# Patient Record
Sex: Male | Born: 1975 | Hispanic: No | Marital: Single | Smoking: Never smoker
Health system: Southern US, Community
[De-identification: ages and names within clinical notes are randomized; demographics above are authoritative.]

## PROBLEM LIST (undated history)

## (undated) DIAGNOSIS — E119 Type 2 diabetes mellitus without complications: Secondary | ICD-10-CM

## (undated) DIAGNOSIS — I10 Essential (primary) hypertension: Secondary | ICD-10-CM

## (undated) DIAGNOSIS — Z955 Presence of coronary angioplasty implant and graft: Secondary | ICD-10-CM

## (undated) DIAGNOSIS — M109 Gout, unspecified: Secondary | ICD-10-CM

## (undated) DIAGNOSIS — K219 Gastro-esophageal reflux disease without esophagitis: Secondary | ICD-10-CM

## (undated) DIAGNOSIS — I509 Heart failure, unspecified: Secondary | ICD-10-CM

## (undated) DIAGNOSIS — I214 Non-ST elevation (NSTEMI) myocardial infarction: Secondary | ICD-10-CM

## (undated) HISTORY — PX: APPENDECTOMY: SHX54

## (undated) HISTORY — DX: Non-ST elevation (NSTEMI) myocardial infarction: I21.4

## (undated) SURGICAL SUPPLY — 1 items: INQWIRE 1.5J .035X260CM (WIRE) ×1 IMPLANT

---

## 1997-05-18 ENCOUNTER — Inpatient Hospital Stay (HOSPITAL_COMMUNITY): Admission: EM | Admit: 1997-05-18 | Discharge: 1997-05-20 | Payer: Self-pay | Admitting: Emergency Medicine

## 2003-12-29 ENCOUNTER — Emergency Department (HOSPITAL_COMMUNITY): Admission: EM | Admit: 2003-12-29 | Discharge: 2003-12-29 | Payer: Self-pay | Admitting: Emergency Medicine

## 2003-12-29 ENCOUNTER — Ambulatory Visit (HOSPITAL_COMMUNITY): Admission: RE | Admit: 2003-12-29 | Discharge: 2003-12-29 | Payer: Self-pay | Admitting: Emergency Medicine

## 2004-01-06 ENCOUNTER — Emergency Department (HOSPITAL_COMMUNITY): Admission: EM | Admit: 2004-01-06 | Discharge: 2004-01-06 | Payer: Self-pay | Admitting: Emergency Medicine

## 2004-04-02 ENCOUNTER — Emergency Department (HOSPITAL_COMMUNITY): Admission: EM | Admit: 2004-04-02 | Discharge: 2004-04-02 | Payer: Self-pay | Admitting: Family Medicine

## 2018-09-21 ENCOUNTER — Encounter (HOSPITAL_COMMUNITY): Payer: Self-pay | Admitting: *Deleted

## 2018-09-21 ENCOUNTER — Emergency Department (HOSPITAL_COMMUNITY)
Admission: EM | Admit: 2018-09-21 | Discharge: 2018-09-21 | Disposition: A | Discharge status: Home / Self Care | Payer: HRSA Program | Attending: Emergency Medicine | Admitting: Emergency Medicine

## 2018-09-21 ENCOUNTER — Emergency Department (HOSPITAL_COMMUNITY): Payer: HRSA Program

## 2018-09-21 ENCOUNTER — Other Ambulatory Visit: Payer: Self-pay

## 2018-09-21 DIAGNOSIS — R05 Cough: Secondary | ICD-10-CM | POA: Insufficient documentation

## 2018-09-21 DIAGNOSIS — R5383 Other fatigue: Secondary | ICD-10-CM | POA: Diagnosis not present

## 2018-09-21 DIAGNOSIS — R079 Chest pain, unspecified: Secondary | ICD-10-CM | POA: Insufficient documentation

## 2018-09-21 DIAGNOSIS — Z20828 Contact with and (suspected) exposure to other viral communicable diseases: Secondary | ICD-10-CM | POA: Diagnosis not present

## 2018-09-21 DIAGNOSIS — R0602 Shortness of breath: Secondary | ICD-10-CM | POA: Diagnosis present

## 2018-09-21 LAB — CBC WITH DIFFERENTIAL/PLATELET
Abs Immature Granulocytes: 0.04 10*3/uL (ref 0.00–0.07)
Basophils Absolute: 0.1 10*3/uL (ref 0.0–0.1)
Basophils Relative: 0 %
Eosinophils Absolute: 0.1 10*3/uL (ref 0.0–0.5)
Eosinophils Relative: 1 %
HCT: 55.7 % — ABNORMAL HIGH (ref 39.0–52.0)
Hemoglobin: 18.6 g/dL — ABNORMAL HIGH (ref 13.0–17.0)
Immature Granulocytes: 0 %
Lymphocytes Relative: 16 %
Lymphs Abs: 1.8 10*3/uL (ref 0.7–4.0)
MCH: 25.5 pg — ABNORMAL LOW (ref 26.0–34.0)
MCHC: 33.4 g/dL (ref 30.0–36.0)
MCV: 76.3 fL — ABNORMAL LOW (ref 80.0–100.0)
Monocytes Absolute: 0.9 10*3/uL (ref 0.1–1.0)
Monocytes Relative: 8 %
Neutro Abs: 8.4 10*3/uL — ABNORMAL HIGH (ref 1.7–7.7)
Neutrophils Relative %: 75 %
Platelets: 267 10*3/uL (ref 150–400)
RBC: 7.3 MIL/uL — ABNORMAL HIGH (ref 4.22–5.81)
RDW: 14.6 % (ref 11.5–15.5)
WBC: 11.2 10*3/uL — ABNORMAL HIGH (ref 4.0–10.5)
nRBC: 0 % (ref 0.0–0.2)

## 2018-09-21 LAB — COMPREHENSIVE METABOLIC PANEL
ALT: 40 U/L (ref 0–44)
AST: 36 U/L (ref 15–41)
Albumin: 4.2 g/dL (ref 3.5–5.0)
Alkaline Phosphatase: 78 U/L (ref 38–126)
Anion gap: 13 (ref 5–15)
BUN: 20 mg/dL (ref 6–20)
CO2: 23 mmol/L (ref 22–32)
Calcium: 9.7 mg/dL (ref 8.9–10.3)
Chloride: 98 mmol/L (ref 98–111)
Creatinine, Ser: 1.14 mg/dL (ref 0.61–1.24)
GFR calc Af Amer: >60 mL/min (ref >60)
GFR calc non Af Amer: >60 mL/min (ref >60)
Glucose, Bld: 181 mg/dL — ABNORMAL HIGH (ref 70–99)
Potassium: 3.6 mmol/L (ref 3.5–5.1)
Sodium: 134 mmol/L — ABNORMAL LOW (ref 135–145)
Total Bilirubin: 1.5 mg/dL — ABNORMAL HIGH (ref 0.3–1.2)
Total Protein: 7.6 g/dL (ref 6.5–8.1)

## 2018-09-21 LAB — SARS CORONAVIRUS 2 BY RT PCR (HOSPITAL ORDER, PERFORMED IN ~~LOC~~ HOSPITAL LAB): SARS Coronavirus 2: NEGATIVE

## 2018-09-21 MED ORDER — SODIUM CHLORIDE 0.9 % IV BOLUS: 1000.0000 mL | Freq: Once | INTRAVENOUS | Status: DC

## 2018-09-21 MED ORDER — IOHEXOL 350 MG/ML SOLN
100.0000 mL | Freq: Once | INTRAVENOUS | Status: AC | PRN
  Administered 2018-09-21: 100 mL via INTRAVENOUS

## 2018-09-21 MED ORDER — ONDANSETRON HCL 4 MG/2ML IJ SOLN
4.0000 mg | Freq: Once | INTRAMUSCULAR | Status: AC
  Administered 2018-09-21: 4 mg via INTRAVENOUS
  Filled 2018-09-21: qty 2

## 2018-09-21 NOTE — ED Triage Notes (Signed)
Pt reports headaches, shortness of breath, nausea, fatigue, and dizziness. Pt says he is from [place] and his mother and father are covid positive. Diaphoretic in triage.

## 2018-09-21 NOTE — Discharge Instructions (Addendum)
You have been seen today for shortness of breath. Please read and follow all provided instructions. Return to the emergency room for worsening condition or new concerning symptoms.    Your covid test today is negative, The CT scan of your chest did not show signs of a pulmonary embolism. -You were dehydrated today, you should try to drink more water over the next several days  1. Medications:  Continue usual home medications Take medications as prescribed. Please review all of the medicines and only take them if you do not have an allergy to them.  -Your blood pressure was also high today. You need to have it rechecked within 1 week. If it continues to be high you needs to see a primary care doctor to talk about possible medication management. I have included the information for [facility] and [facility] if you do not have a doctor. Call to schedule follow up appointment within 1 week  2. Treatment: rest, drink plenty of fluids 3. Follow Up: Please follow up with your primary doctor in 2-5 days for discussion of your diagnoses and further evaluation after today's visit; Call today to arrange your follow up.  If you do not have a primary care doctor use the resource guide provided to find one;   ?

## 2018-09-21 NOTE — ED Notes (Addendum)
Pt tolerated ambulation. Pt experience ST HR 120-130, SpO2 93-98... Pt denied SOB, but experience mild dizziness

## 2018-09-21 NOTE — ED Provider Notes (Signed)
[name].  Please see her full H&P.  In short,  Darren Garrett is a 43 y.o. male presents for shortness of breath.  He was concerned to have COVID-19 because he has positive family members that he has visited recently in [place].Today his COVID test is negative.  Work up by previous provider includes unremarkable CMP.  CBC with mild leukocytosis of 11.2.  Patient is hemoconcentrated, possible dehydration. CTA pending for possible PE. If negative will discharge home,  Physical Exam  BP (!) 145/100   Pulse (!) 101   Temp 98.3 F (36.8 C) (Oral)   Resp 18   SpO2 99%   Physical Exam  PE: Constitutional: well-developed, well-nourished, no apparent distress HENT: normocephalic, atraumatic. no cervical adenopathy Cardiovascular: normal rate and rhythm, distal pulses intact Pulmonary/Chest: effort normal; breath sounds clear and equal bilaterally; no wheezes or rales Abdominal: soft and nontender Musculoskeletal: full ROM, no edema Neurological: alert with goal directed thinking Skin: warm and dry, no rash, no diaphoresis Psychiatric: normal mood and affect, normal behavior    ED Course/Procedures   Results for orders placed or performed during the hospital encounter of 09/21/18 (from the past 24 hour(s))  SARS Coronavirus 2 [facility](Hospital order, Performed in [facility] lab) Nasopharyngeal Nasopharyngeal Swab     Status: None   Collection Time: 09/21/18  4:19 AM   Specimen: Nasopharyngeal Swab  Result Value Ref Range   SARS Coronavirus 2 NEGATIVE NEGATIVE  CBC with Differential     Status: Abnormal   Collection Time: 09/21/18  4:37 AM  Result Value Ref Range   WBC 11.2 (H) 4.0 - 10.5 K/uL   RBC 7.30 (H) 4.22 - 5.81 MIL/uL   Hemoglobin 18.6 (H) 13.0 - 17.0 g/dL   HCT 81.155.7 (H) 91.439.0 - 78.252.0 %   MCV 76.3 (L) 80.0 - 100.0 fL   MCH 25.5 (L) 26.0 - 34.0 pg   MCHC 33.4 30.0 - 36.0 g/dL   RDW 95.614.6 21.311.5 - 08.615.5 %   Platelets 267 150 - 400 K/uL   nRBC 0.0 0.0 - 0.2  %   Neutrophils Relative % 75 %   Neutro Abs 8.4 (H) 1.7 - 7.7 K/uL   Lymphocytes Relative 16 %   Lymphs Abs 1.8 0.7 - 4.0 K/uL   Monocytes Relative 8 %   Monocytes Absolute 0.9 0.1 - 1.0 K/uL   Eosinophils Relative 1 %   Eosinophils Absolute 0.1 0.0 - 0.5 K/uL   Basophils Relative 0 %   Basophils Absolute 0.1 0.0 - 0.1 K/uL   Immature Granulocytes 0 %   Abs Immature Granulocytes 0.04 0.00 - 0.07 K/uL  Comprehensive metabolic panel     Status: Abnormal   Collection Time: 09/21/18  4:37 AM  Result Value Ref Range   Sodium 134 (L) 135 - 145 mmol/L   Potassium 3.6 3.5 - 5.1 mmol/L   Chloride 98 98 - 111 mmol/L   CO2 23 22 - 32 mmol/L   Glucose, Bld 181 (H) 70 - 99 mg/dL   BUN 20 6 - 20 mg/dL   Creatinine, Ser 5.781.14 0.61 - 1.24 mg/dL   Calcium 9.7 8.9 - 46.910.3 mg/dL   Total Protein 7.6 6.5 - 8.1 g/dL   Albumin 4.2 3.5 - 5.0 g/dL   AST 36 15 - 41 U/L   ALT 40 0 - 44 U/L   Alkaline Phosphatase 78 38 - 126 U/L   Total Bilirubin 1.5 (H) 0.3 - 1.2 mg/dL  GFR calc non Af Amer >60 >60 mL/min   GFR calc Af Amer >60 >60 mL/min   Anion gap 13 5 - 15   CT ANGIOGRAPHY CHEST WITH CONTRAST    TECHNIQUE:  Multidetector CT imaging of the chest was performed using the  standard protocol during bolus administration of intravenous  contrast. Multiplanar CT image reconstructions and MIPs were  obtained to evaluate the vascular anatomy.    CONTRAST: 67 mL OMNIPAQUE IOHEXOL 350 MG/ML SOLN    COMPARISON: Radiograph of same day.    FINDINGS:  Cardiovascular: Satisfactory opacification of the pulmonary arteries  to the segmental level. No evidence of pulmonary embolism. Normal  heart size. No pericardial effusion.    Mediastinum/Nodes: No enlarged mediastinal, hilar, or axillary lymph  nodes. Thyroid gland, trachea, and esophagus demonstrate no  significant findings.    Lungs/Pleura: Lungs are clear. No pleural effusion or pneumothorax.    Upper Abdomen: No acute abnormality.     Musculoskeletal: No chest wall abnormality. No acute or significant  osseous findings.    Review of the MIP images confirms the above findings.    IMPRESSION:  No definite evidence of pulmonary embolus. No definite abnormality  seen in the chest.      Electronically Signed  By: Marijo Conception M.D.  On: 09/21/2018 07:23   PORTABLE CHEST 1 VIEW    COMPARISON: Prior radiograph from 11/20/2006.    FINDINGS:  The cardiac and mediastinal silhouettes are stable in size and  contour, and remain within normal limits.    The lungs are normally inflated. No airspace consolidation, pleural  effusion, or pulmonary edema is identified. There is no  pneumothorax.    No acute osseous abnormality identified.    IMPRESSION:  No radiographic evidence for active cardiopulmonary disease.      Electronically Signed  By: Jeannine Boga M.D.  On: 09/21/2018 02:38       MDM   Patient received in signout.  His CTA is negative for PE.  After drinking p.o. fluids his tachycardia improved.  While I was in the room during exam his heart rate was below 100. I ambulated pt in the room and he did so without difficulty, no dyspnea, stable SpO2 97% on room air.  His blood pressure slightly elevated today.  Recommend he have this rechecked by primary care doctor within 1 week.  Patient does not have a primary care doctor.  He was given information for healthcare to clinic. Pt is stable to be discharged home. Strict ED return precautions given.   This note was prepared using Dragon voice recognition software and may include unintentional dictation errors due to the inherent limitations of voice recognition software.     Cherre Robins, PA-C 09/21/18 0867    Lajean Saver, MD 09/22/18 [phone]

## 2018-09-21 NOTE — ED Provider Notes (Signed)
[place] EMERGENCY DEPARTMENT Provider Note   CSN: [id number] Arrival date & time: 09/21/18  0110     History   Chief Complaint Chief Complaint  Patient presents with  . Shortness of Breath    HPI Darren Garrett is a 43 y.o. male.     Patient to ED with concern for having contracted COVID-19. He lives in [place] and recently visited family in [place] that tested positive 3 weeks ago. He reports his contact was limited however presents now with symptoms of fatigue, aches, cough, SOB, and nausea. No fever. No vomiting, diarrhea.   The history is provided by the patient. No language interpreter was used.  Shortness of Breath Associated symptoms: cough and diaphoresis   Associated symptoms: no fever and no vomiting     History reviewed. No pertinent past medical history.  There are no active problems to display for this patient.   History reviewed. No pertinent surgical history.      Home Medications    Prior to Admission medications   Not on File    Family History No family history on file.  Social History Social History   Tobacco Use  . Smoking status: Never Smoker  Substance Use Topics  . Alcohol use: Not Currently  . Drug use: Not Currently     Allergies   Patient has no known allergies.   Review of Systems Review of Systems  Constitutional: Positive for diaphoresis and fatigue. Negative for chills and fever.  HENT: Negative.   Respiratory: Positive for cough and shortness of breath.   Cardiovascular: Negative.   Gastrointestinal: Positive for nausea. Negative for vomiting.  Musculoskeletal: Negative.   Skin: Negative.   Neurological: Negative.      Physical Exam Updated Vital Signs BP (!) 156/96   Pulse (!) 107   Temp 98.3 F (36.8 C) (Oral)   Resp 18   SpO2 100%   Physical Exam Vitals signs and nursing note reviewed.  Constitutional:      Appearance: He is well-developed.  HENT:     Head: Normocephalic.  Neck:    Musculoskeletal: Normal range of motion and neck supple.  Cardiovascular:     Rate and Rhythm: Normal rate and regular rhythm.     Heart sounds: No murmur.  Pulmonary:     Effort: Pulmonary effort is normal.     Breath sounds: Normal breath sounds. No wheezing, rhonchi or rales.  Chest:     Chest wall: No tenderness.  Abdominal:     General: Bowel sounds are normal.     Palpations: Abdomen is soft.     Tenderness: There is no abdominal tenderness. There is no guarding or rebound.  Musculoskeletal: Normal range of motion.  Skin:    General: Skin is warm and dry.  Neurological:     Mental Status: He is alert and oriented to person, place, and time.      ED Treatments / Results  Labs (all labs ordered are listed, but only abnormal results are displayed) Labs Reviewed  SARS CORONAVIRUS 2 (HOSPITAL ORDER, [facility])  CBC WITH DIFFERENTIAL/PLATELET  COMPREHENSIVE METABOLIC PANEL    EKG EKG Interpretation  Date/Time:  Friday September 21 2018 01:20:26 EDT Ventricular Rate:  96 PR Interval:  134 QRS Duration: 78 QT Interval:  362 QTC Calculation: 457 R Axis:   72 Text Interpretation:  Normal sinus rhythm Normal ECG No previous ECGs available Confirmed by Ripley Fraise [phone]) on 09/21/2018 1:28:03 AM  Radiology Dg Chest Portable 1 View  Result Date: 09/21/2018 CLINICAL DATA:  Initial evaluation for acute shortness of breath. EXAM: PORTABLE CHEST 1 VIEW COMPARISON:  Prior radiograph from 11/20/2006. FINDINGS: The cardiac and mediastinal silhouettes are stable in size and contour, and remain within normal limits. The lungs are normally inflated. No airspace consolidation, pleural effusion, or pulmonary edema is identified. There is no pneumothorax. No acute osseous abnormality identified. IMPRESSION: No radiographic evidence for active cardiopulmonary disease. Electronically Signed   By: Rise MuBenjamin  McClintock M.D.   On: 09/21/2018 02:38     Procedures Procedures (including critical care time)  Medications Ordered in ED Medications  sodium chloride 0.9 % bolus 1,000 mL (has no administration in time range)     Initial Impression / Assessment and Plan / ED Course  I have reviewed the triage vital signs and the nursing notes.  Pertinent labs & imaging results that were available during my care of the patient were reviewed by me and considered in my medical decision making (see chart for details).        Patient to ED with symptoms of nausea, fatigue, cough, SOB, and body aches. He lives in a high endemic area for COVID-19 and has also had family members test positive for the virus.   The patient has concerning symptoms for COVID. At rest there is no SoB or hypoxia. He reports DOE. He is tachycardic. DDx: PE must be considered given he has driven from [place] to [place] multiple times in the last 2 weeks.   The patient is ambulated with O2 sats that go to 92-94% from 100% while at rest. Heart rate increases to 150's. CTA ordered. Rapid COVID pending.  COVID-19 negative. CTA pending for PE. Patient resting comfortably.  6:45 - delay in CTA r/o PE. Per CT tech, study can proceed and they will come for him soon.  Patient care signed out to oncoming provider team at end of shift pending CT results.   Final Clinical Impressions(s) / ED Diagnoses   Final diagnoses:  None   1. SOB 2. Chest pain 3. Fatigue  ED Discharge Orders    None       Danne HarborUpstill, Alton Tremblay, PA-C 09/21/18 0701    Palumbo, April, MD 09/21/18 [phone]

## 2018-09-21 NOTE — ED Triage Notes (Signed)
Pt arrives via [facility] from home. 2 family members are COVID positive in the home, fatigue, SOB, no fevers.

## 2020-05-27 ENCOUNTER — Other Ambulatory Visit: Payer: Self-pay

## 2020-05-27 ENCOUNTER — Ambulatory Visit (HOSPITAL_COMMUNITY)
Admission: EM | Admit: 2020-05-27 | Discharge: 2020-05-27 | Disposition: A | Discharge status: Home / Self Care | Payer: Self-pay

## 2020-05-27 ENCOUNTER — Encounter (HOSPITAL_COMMUNITY): Payer: Self-pay | Admitting: Emergency Medicine

## 2020-05-27 DIAGNOSIS — R509 Fever, unspecified: Secondary | ICD-10-CM

## 2020-05-27 DIAGNOSIS — I10 Essential (primary) hypertension: Secondary | ICD-10-CM

## 2020-05-27 DIAGNOSIS — R000 Tachycardia, unspecified: Secondary | ICD-10-CM

## 2020-05-27 DIAGNOSIS — R519 Headache, unspecified: Secondary | ICD-10-CM

## 2020-05-27 DIAGNOSIS — I160 Hypertensive urgency: Secondary | ICD-10-CM

## 2020-05-27 NOTE — ED Triage Notes (Signed)
Monday started sneezing.  Tuesday night, started feeling sick with headache, general body aches and chills

## 2020-05-27 NOTE — ED Provider Notes (Addendum)
[facility]    CSN: [id number] Arrival date & time: 05/27/20  1449      History   Chief Complaint Chief Complaint  Patient presents with  . Headache  . Generalized Body Aches    HPI Darren Garrett is a 45 y.o. male. Pt declines translator and is able to appropriately answer my questions.   HPI   Headache: Pt reports that on Monday he started sneezing on Monday. The next day he started having headache, general body aches and chills. He states that normally when he gets similar symptoms he goes to his primary care office and gets a "shot and in two hours [he] feels better and goes back to work". Today though he has a low grade fever which is abnormal so he questions if he has a virus. He also reports that his headache is worse than normal. No vomiting, neck stiffness, N/V/D. Has had some decreased appetite and loss of taste/smell. No visual changes, SOB, chest pain. He has not tried anything for symptoms but reports that he has had elevated BP in the past but has never started any BP medication.   History reviewed. No pertinent past medical history.  There are no problems to display for this patient.   Past Surgical History:  Procedure Laterality Date  . APPENDECTOMY       Home Medications    Prior to Admission medications   Medication Sig Start Date End Date Taking? Authorizing Provider  acetaminophen (TYLENOL) 325 MG tablet Take 650 mg by mouth every 6 (six) hours as needed.   Yes [provider]  ibuprofen (ADVIL) 200 MG tablet Take 200 mg by mouth every 6 (six) hours as needed.   Yes [provider]    Family History Family History  Problem Relation Age of Onset  . Healthy Mother     Social History Social History   Tobacco Use  . Smoking status: Never Smoker  . Smokeless tobacco: Never Used  Vaping Use  . Vaping Use: Never used  Substance Use Topics  . Alcohol use: Yes  . Drug use: Not Currently     Allergies   Patient has  no known allergies.   Review of Systems Review of Systems  As stated above in HPI Physical Exam Triage Vital Signs ED Triage Vitals  Enc Vitals Group     BP 05/27/20 1516 (!) 164/109     Pulse Rate 05/27/20 1516 (!) 123     Resp 05/27/20 1516 20     Temp 05/27/20 1516 99 F (37.2 C)     Temp Source 05/27/20 1516 Oral     SpO2 05/27/20 1516 97 %     Weight --      Height --      Head Circumference --      Peak Flow --      Pain Score 05/27/20 1512 10     Pain Loc --      Pain Edu? --      Excl. in GC? --    No data found.  Updated Vital Signs BP (!) 165/113 (BP Location: Left Arm) Comment (BP Location): repositioned  Pulse (!) 125   Temp 99 F (37.2 C) (Oral)   Resp 20   SpO2 97%   Physical Exam Vitals and nursing note reviewed.  Constitutional:      General: He is not in acute distress.    Appearance: He is well-developed. He is not ill-appearing, toxic-appearing or diaphoretic.  HENT:     Head: Normocephalic and atraumatic.     Mouth/Throat:     Mouth: Mucous membranes are moist.     Pharynx: Oropharynx is clear.  Eyes:     Extraocular Movements:     Right eye: Normal extraocular motion.     Left eye: Normal extraocular motion.     Pupils:     Right eye: Pupil is reactive.     Left eye: Pupil is not reactive.  Cardiovascular:     Rate and Rhythm: Normal rate and regular rhythm.     Heart sounds: Normal heart sounds.  Pulmonary:     Effort: Pulmonary effort is normal.     Breath sounds: Normal breath sounds.  Abdominal:     Palpations: Abdomen is soft.  Musculoskeletal:     Cervical back: Normal range of motion and neck supple.  Lymphadenopathy:     Cervical: No cervical adenopathy.  Skin:    General: Skin is warm.     Findings: No rash.  Neurological:     Mental Status: He is alert and oriented to person, place, and time.     Cranial Nerves: No cranial nerve deficit or facial asymmetry.     Sensory: No sensory deficit.     Motor: No weakness.      Coordination: Coordination normal.     Deep Tendon Reflexes: Reflexes normal.  Psychiatric:        Mood and Affect: Mood normal.        Speech: Speech normal.        Behavior: Behavior normal.      UC Treatments / Results  Labs (all labs ordered are listed, but only abnormal results are displayed) Labs Reviewed - No data to display  EKG   Radiology No results found.  Procedures Procedures (including critical care time)  Medications Ordered in UC Medications - No data to display  Initial Impression / Assessment and Plan / UC Course  I have reviewed the triage vital signs and the nursing notes.  Pertinent labs & imaging results that were available during my care of the patient were reviewed by me and considered in my medical decision making (see chart for details).     New. Likely viral in nature which has spurred on a migraine. Symptoms concerning for COVID-19 so testing is pending and he will take precautions. Will administer Toradol as this may lower his BP due to reduced pain. He will need to follow up with his PCP regarding his BP and will need to stay well hydrated with water.   UPDATE: Vitals recheck shows elevated BP with headache. Hypertensive urgency with tachycardia. He will need to be evaluated and treated in the emergency room to avoid systemic injury. He declined EMS and ER. I discussed with him my concerns multiple times. He finally agrees to go to the ER via private vehicle.    Final Clinical Impressions(s) / UC Diagnoses   Final diagnoses:  None   Discharge Instructions   None    ED Prescriptions    None     PDMP not reviewed this encounter.   Karen Kitchens 05/27/20 1620    Rushie Chestnut, PA-C 05/27/20 1637    Rushie Chestnut, PA-C 05/27/20 [phone]

## 2020-08-21 IMAGING — DX PORTABLE CHEST - 1 VIEW
1 series · 1 of 1 positions shown · non-contrast
Comparison: Prior radiograph from 11/20/2006.

CLINICAL DATA: Initial evaluation for acute shortness of breath.

EXAM:
PORTABLE CHEST 1 VIEW

[chest ap]
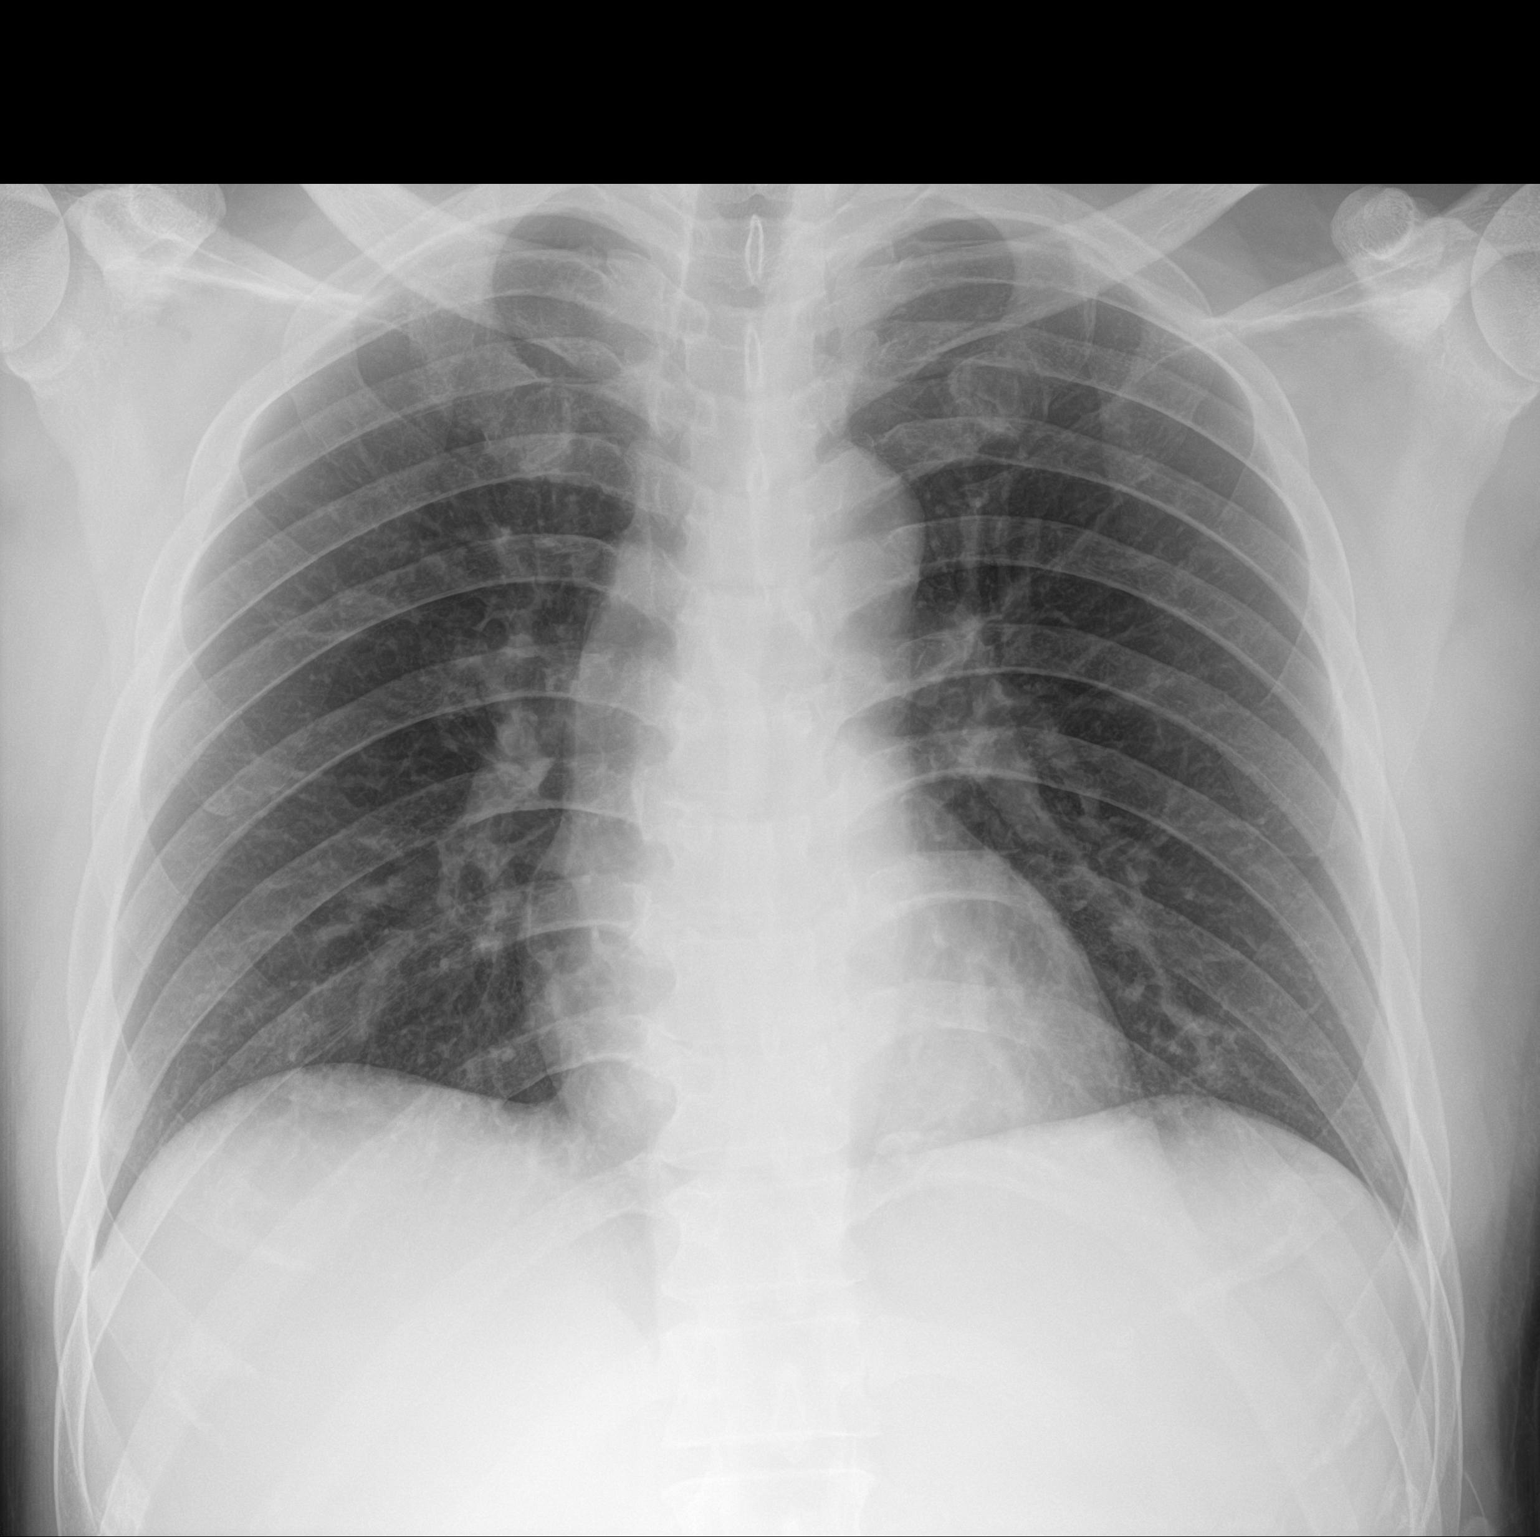

[1 of 1 positions shown; findings below may reference images not displayed]

FINDINGS: The cardiac and mediastinal silhouettes are stable in size and
contour, and remain within normal limits.

The lungs are normally inflated. No airspace consolidation, pleural
effusion, or pulmonary edema is identified. There is no
pneumothorax.

No acute osseous abnormality identified.
IMPRESSION: No radiographic evidence for active cardiopulmonary disease.

## 2020-08-21 IMAGING — CT CT ANGIOGRAPHY CHEST
2 of 6 series · 19 of 46 positions shown · IV contrast (omnipaque)
Comparison: Radiograph of same day.

CLINICAL DATA: Shortness of breath.

EXAM:
CT ANGIOGRAPHY CHEST WITH CONTRAST
TECHNIQUE: Multidetector CT imaging of the chest was performed using the
standard protocol during bolus administration of intravenous
contrast. Multiplanar CT image reconstructions and MIPs were
obtained to evaluate the vascular anatomy.
CONTRAST:  67 mL OMNIPAQUE IOHEXOL 350 MG/ML SOLN

[Series 6: thins · axial · 0.65mm/px · z∈[+1126,+1356]mm · 16 of 254 slices shown]
[im 12/254  lung]
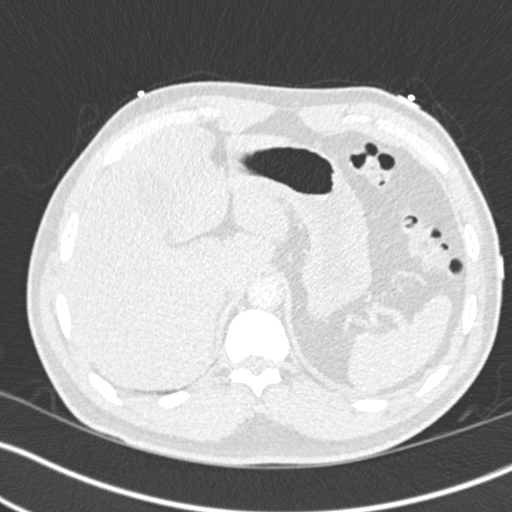
[im 34/254  soft-tissue]
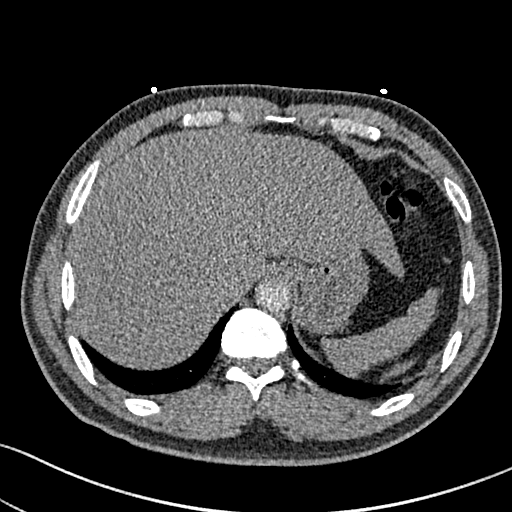
[im 45/254  lung]
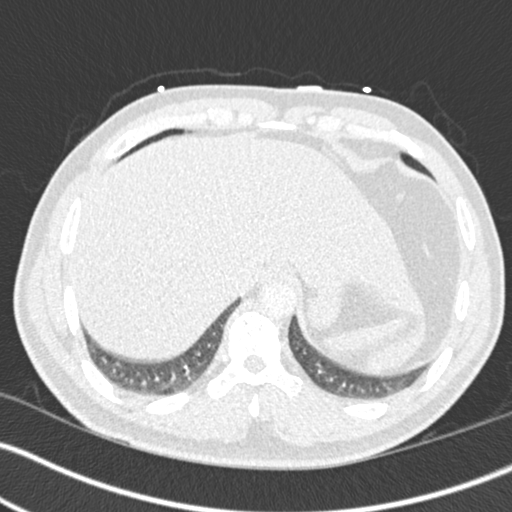
[im 56/254  soft-tissue]
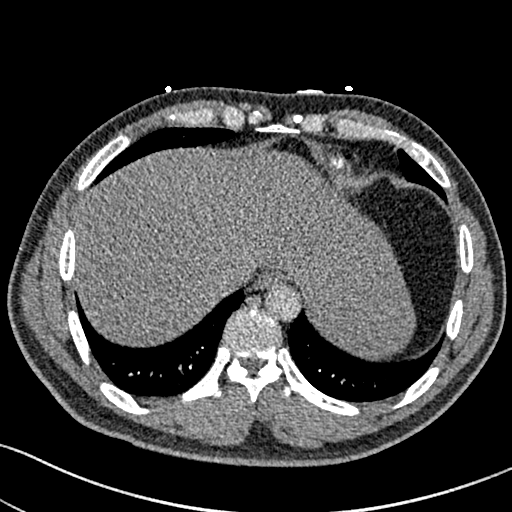
[im 78/254  lung]
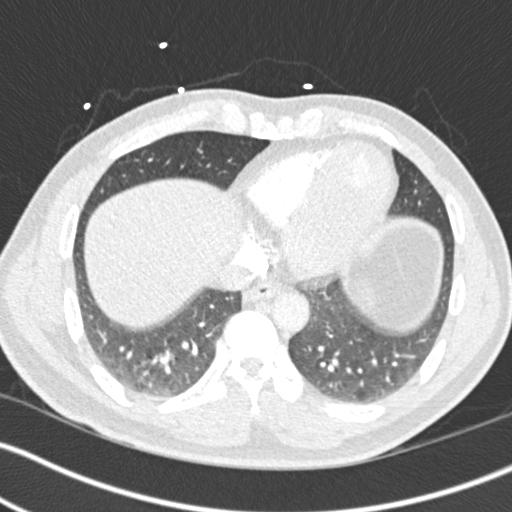
[im 89/254  soft-tissue]
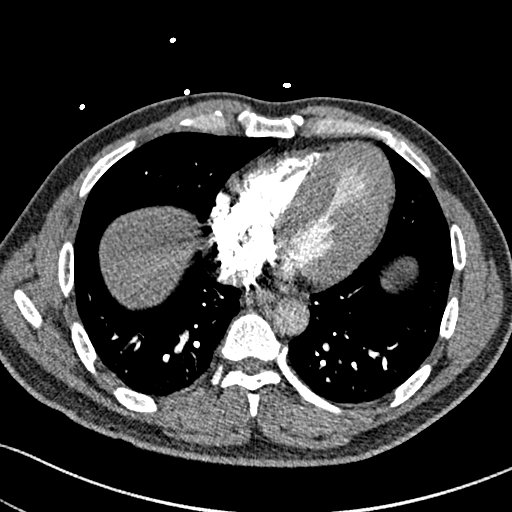
[im 100/254  lung]
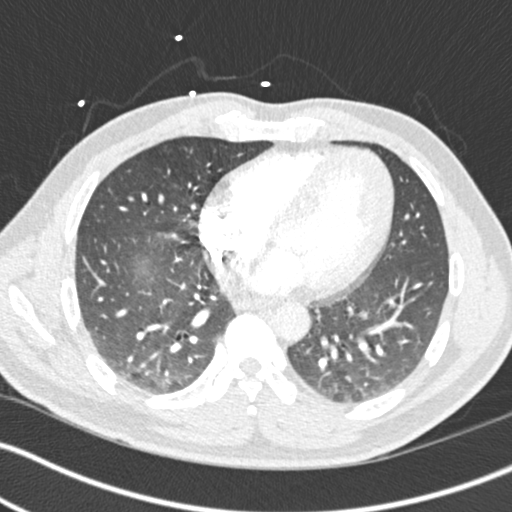
[im 122/254  soft-tissue]
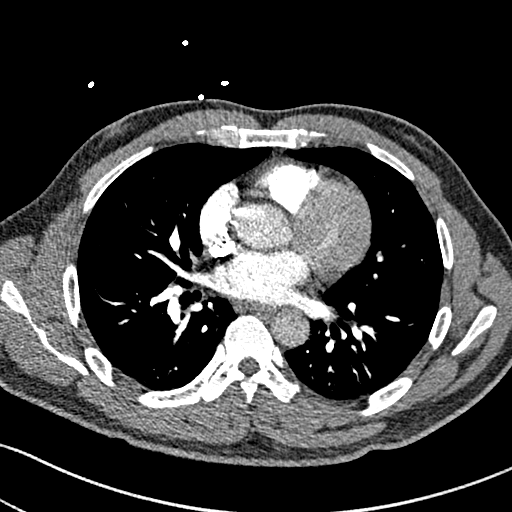
[im 133/254  lung]
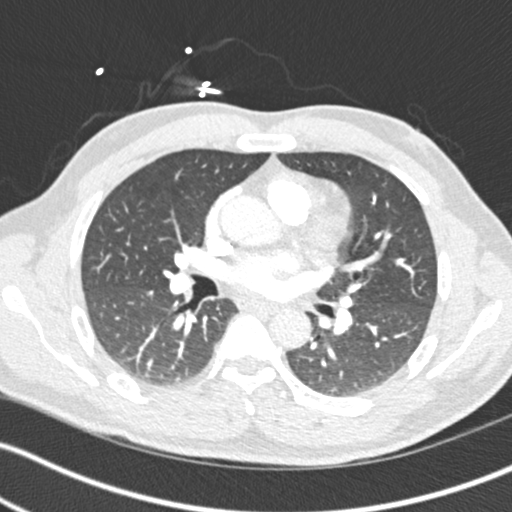
[im 155/254  soft-tissue]
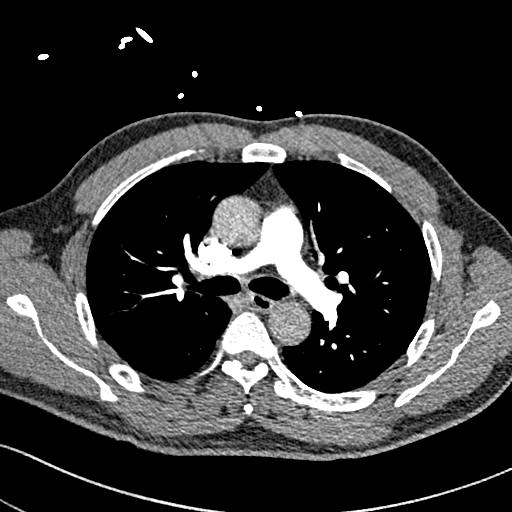
[im 166/254  lung]
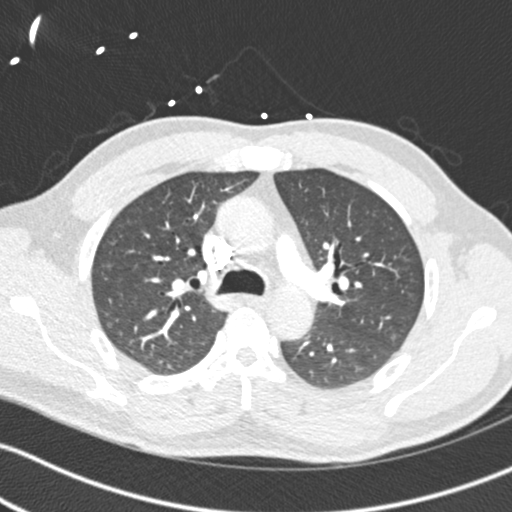
[im 177/254  soft-tissue]
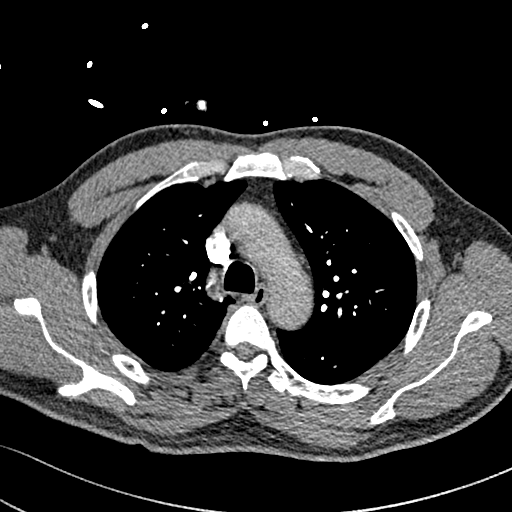
[im 199/254  lung]
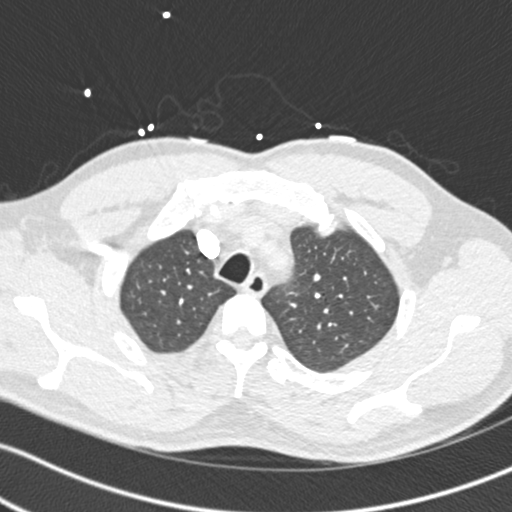
[im 210/254  soft-tissue]
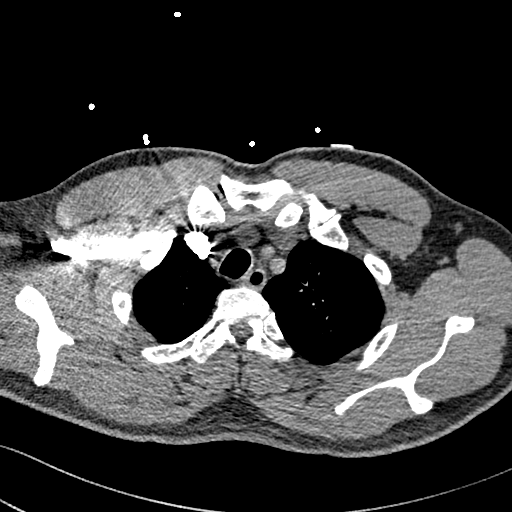
[im 221/254  lung]
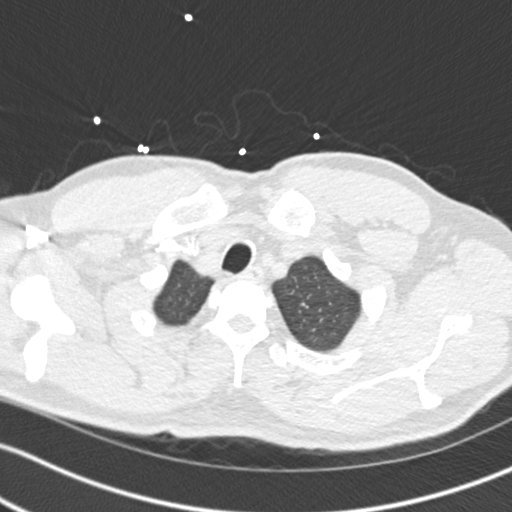
[im 243/254  soft-tissue]
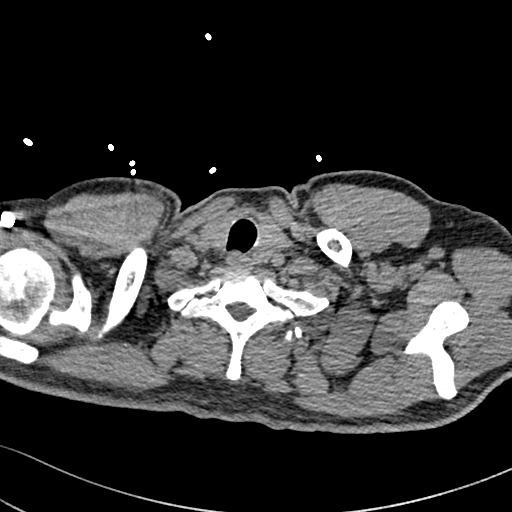

[Series 8: coronal mpr · coronal · 0.52mm/px · 3 of 131 slices shown]
[im 33/131  soft-tissue]
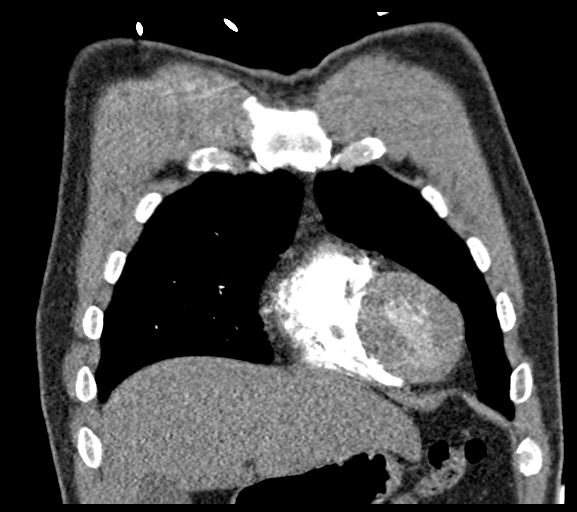
[im 66/131  soft-tissue]
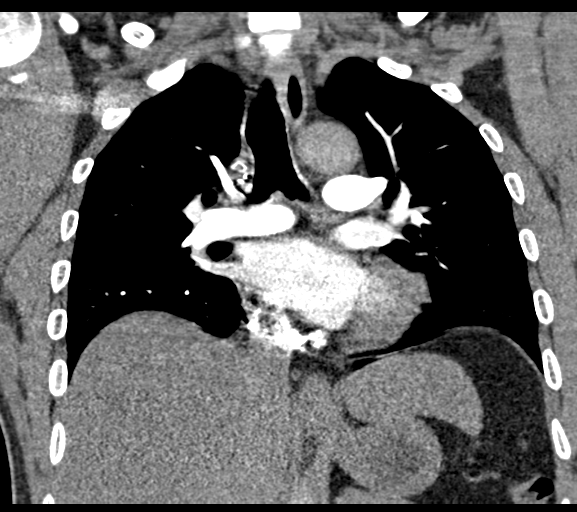
[im 98/131  soft-tissue]
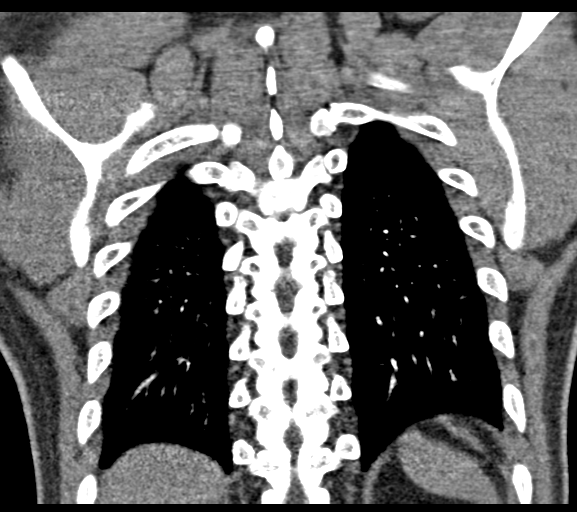

[19 of 46 positions shown; findings below may reference images not displayed]

FINDINGS: Cardiovascular: Satisfactory opacification of the pulmonary arteries
to the segmental level. No evidence of pulmonary embolism. Normal
heart size. No pericardial effusion.

Mediastinum/Nodes: No enlarged mediastinal, hilar, or axillary lymph
nodes. Thyroid gland, trachea, and esophagus demonstrate no
significant findings.

Lungs/Pleura: Lungs are clear. No pleural effusion or pneumothorax.

Upper Abdomen: No acute abnormality.

Musculoskeletal: No chest wall abnormality. No acute or significant
osseous findings.

Review of the MIP images confirms the above findings.
IMPRESSION: No definite evidence of pulmonary embolus. No definite abnormality
seen in the chest.

## 2023-04-02 ENCOUNTER — Emergency Department (HOSPITAL_COMMUNITY)

## 2023-04-02 ENCOUNTER — Inpatient Hospital Stay (HOSPITAL_COMMUNITY)
Admission: EM | Admit: 2023-04-02 | Discharge: 2023-04-04 | DRG: 282 | Disposition: A | Discharge status: Home / Self Care | Attending: Cardiovascular Disease | Admitting: Cardiovascular Disease

## 2023-04-02 ENCOUNTER — Encounter (HOSPITAL_COMMUNITY): Payer: Self-pay

## 2023-04-02 ENCOUNTER — Other Ambulatory Visit: Payer: Self-pay

## 2023-04-02 DIAGNOSIS — I214 Non-ST elevation (NSTEMI) myocardial infarction: Principal | ICD-10-CM | POA: Diagnosis present

## 2023-04-02 DIAGNOSIS — E118 Type 2 diabetes mellitus with unspecified complications: Secondary | ICD-10-CM

## 2023-04-02 DIAGNOSIS — I10 Essential (primary) hypertension: Secondary | ICD-10-CM

## 2023-04-02 DIAGNOSIS — Z7982 Long term (current) use of aspirin: Secondary | ICD-10-CM

## 2023-04-02 DIAGNOSIS — I2510 Atherosclerotic heart disease of native coronary artery without angina pectoris: Secondary | ICD-10-CM | POA: Diagnosis present

## 2023-04-02 DIAGNOSIS — I509 Heart failure, unspecified: Secondary | ICD-10-CM | POA: Diagnosis not present

## 2023-04-02 DIAGNOSIS — E785 Hyperlipidemia, unspecified: Secondary | ICD-10-CM | POA: Insufficient documentation

## 2023-04-02 DIAGNOSIS — Y831 Surgical operation with implant of artificial internal device as the cause of abnormal reaction of the patient, or of later complication, without mention of misadventure at the time of the procedure: Secondary | ICD-10-CM | POA: Diagnosis present

## 2023-04-02 DIAGNOSIS — Z79899 Other long term (current) drug therapy: Secondary | ICD-10-CM | POA: Diagnosis not present

## 2023-04-02 DIAGNOSIS — I2109 ST elevation (STEMI) myocardial infarction involving other coronary artery of anterior wall: Secondary | ICD-10-CM | POA: Diagnosis present

## 2023-04-02 DIAGNOSIS — I110 Hypertensive heart disease with heart failure: Secondary | ICD-10-CM | POA: Diagnosis present

## 2023-04-02 DIAGNOSIS — T82855A Stenosis of coronary artery stent, initial encounter: Principal | ICD-10-CM | POA: Diagnosis present

## 2023-04-02 DIAGNOSIS — I222 Subsequent non-ST elevation (NSTEMI) myocardial infarction: Secondary | ICD-10-CM | POA: Diagnosis present

## 2023-04-02 DIAGNOSIS — Z794 Long term (current) use of insulin: Secondary | ICD-10-CM | POA: Diagnosis not present

## 2023-04-02 DIAGNOSIS — K219 Gastro-esophageal reflux disease without esophagitis: Secondary | ICD-10-CM | POA: Diagnosis not present

## 2023-04-02 DIAGNOSIS — E782 Mixed hyperlipidemia: Secondary | ICD-10-CM

## 2023-04-02 DIAGNOSIS — Z955 Presence of coronary angioplasty implant and graft: Secondary | ICD-10-CM | POA: Diagnosis not present

## 2023-04-02 DIAGNOSIS — Z7984 Long term (current) use of oral hypoglycemic drugs: Secondary | ICD-10-CM | POA: Diagnosis not present

## 2023-04-02 HISTORY — DX: Essential (primary) hypertension: I10

## 2023-04-02 HISTORY — DX: Heart failure, unspecified: I50.9

## 2023-04-02 HISTORY — DX: Gout, unspecified: M10.9

## 2023-04-02 HISTORY — DX: Type 2 diabetes mellitus without complications: E11.9

## 2023-04-02 HISTORY — DX: Presence of coronary angioplasty implant and graft: Z95.5

## 2023-04-02 HISTORY — DX: Gastro-esophageal reflux disease without esophagitis: K21.9

## 2023-04-02 LAB — CBC
HCT: 42.1 % (ref 39.0–52.0)
Hemoglobin: 13.5 g/dL (ref 13.0–17.0)
MCH: 25.3 pg — ABNORMAL LOW (ref 26.0–34.0)
MCHC: 32.1 g/dL (ref 30.0–36.0)
MCV: 78.8 fL — ABNORMAL LOW (ref 80.0–100.0)
Platelets: 322 10*3/uL (ref 150–400)
RBC: 5.34 MIL/uL (ref 4.22–5.81)
RDW: 13.3 % (ref 11.5–15.5)
WBC: 9.2 10*3/uL (ref 4.0–10.5)
nRBC: 0 % (ref 0.0–0.2)

## 2023-04-02 LAB — TROPONIN I (HIGH SENSITIVITY)
Troponin I (High Sensitivity): 808 ng/L (ref <18)
Troponin I (High Sensitivity): 1961 ng/L (ref <18)

## 2023-04-02 LAB — BASIC METABOLIC PANEL
Anion gap: 10 (ref 5–15)
BUN: 15 mg/dL (ref 6–20)
CO2: 22 mmol/L (ref 22–32)
Calcium: 8.7 mg/dL — ABNORMAL LOW (ref 8.9–10.3)
Chloride: 103 mmol/L (ref 98–111)
Creatinine, Ser: 0.94 mg/dL (ref 0.61–1.24)
GFR, Estimated: >60 mL/min (ref >60)
Glucose, Bld: 198 mg/dL — ABNORMAL HIGH (ref 70–99)
Potassium: 3.4 mmol/L — ABNORMAL LOW (ref 3.5–5.1)
Sodium: 135 mmol/L (ref 135–145)

## 2023-04-02 MED ORDER — ASPIRIN 325 MG PO TABS
325.0000 mg | ORAL_TABLET | Freq: Once | ORAL | Status: AC
  Administered 2023-04-02: 325 mg via ORAL
  Filled 2023-04-02: qty 1

## 2023-04-02 MED ORDER — HEPARIN BOLUS VIA INFUSION
4000.0000 [IU] | Freq: Once | INTRAVENOUS | Status: AC
  Administered 2023-04-02: 4000 [IU] via INTRAVENOUS
  Filled 2023-04-02: qty 4000

## 2023-04-02 MED ORDER — NITROGLYCERIN IN D5W 200-5 MCG/ML-% IV SOLN
0.0000 ug/min | INTRAVENOUS | Status: DC
  Administered 2023-04-02: 5 ug/min via INTRAVENOUS
  Filled 2023-04-02: qty 250

## 2023-04-02 MED ORDER — HEPARIN (PORCINE) 25000 UT/250ML-% IV SOLN
1050.0000 [IU]/h | INTRAVENOUS | Status: DC
  Administered 2023-04-02: 1050 [IU]/h via INTRAVENOUS
  Filled 2023-04-02: qty 250

## 2023-04-02 MED ORDER — MORPHINE SULFATE (PF) 4 MG/ML IV SOLN
4.0000 mg | Freq: Once | INTRAVENOUS | Status: AC
  Administered 2023-04-03: 4 mg via INTRAVENOUS
  Filled 2023-04-02: qty 1

## 2023-04-02 MED ORDER — NITROGLYCERIN 0.4 MG SL SUBL
0.4000 mg | SUBLINGUAL_TABLET | SUBLINGUAL | Status: DC | PRN
  Administered 2023-04-02 (×2): 0.4 mg via SUBLINGUAL
  Filled 2023-04-02: qty 1

## 2023-04-02 NOTE — ED Notes (Signed)
 [place] PIV placed.

## 2023-04-02 NOTE — ED Notes (Signed)
[facility] called for transport to [facility] 

## 2023-04-02 NOTE — ED Provider Notes (Signed)
 [place] [facility]ERGENC[facility] DEPARTMENT AT [facility] Provider Note   CSN: [id number] Arrival date & time: 04/02/23  1828     History  Chief Complaint  Patient presents with   Chest Pain    Darren Garrett is a 48 y.o. male hx of CAD s/p stents, HTN, DM, here with chest pain. Patient states that he was recently hospitalized at [facility].  He states that he had a cardiac catheterization and had 2 stents placed.  Patient is on Brilinta currently.  Patient drove here yesterday to visit family.  He states that he had acute onset of chest pressure and shortness of breath this morning.  He states that he took his medicines as prescribed but it has not helped.  He did not take any nitroglycerin today.  The history is provided by the patient.       Home Medications Prior to Admission medications   Not on File      Allergies    Patient has no known allergies.    Review of Systems   Review of Systems  Cardiovascular:  Positive for chest pain.  All other systems reviewed and are negative.   Physical Exam Updated Vital Signs BP (!) 180/94   Pulse 78   Temp 97.8 F (36.6 C) (Oral)   Resp 16   Ht 5\' 6"  (1.676 m)   Wt 74.8 kg   SpO2 100%   BMI 26.63 kg/m  Physical Exam Vitals and nursing note reviewed.  Constitutional:      Comments: Uncomfortable and diaphoretic  HENT:     Head: Normocephalic.  Eyes:     Extraocular Movements: Extraocular movements intact.     Pupils: Pupils are equal, round, and reactive to light.  Cardiovascular:     Rate and Rhythm: Normal rate and regular rhythm.     Heart sounds: Normal heart sounds.  Pulmonary:     Effort: Pulmonary effort is normal.     Breath sounds: Normal breath sounds.  Abdominal:     General: Bowel sounds are normal.     Palpations: Abdomen is soft.  Musculoskeletal:        General: Normal range of motion.     Cervical back: Normal range of motion and neck supple.  Skin:    General: Skin is warm.      Capillary Refill: Capillary refill takes less than 2 seconds.  Neurological:     General: No focal deficit present.     Mental Status: He is oriented to person, place, and time.  Psychiatric:        Mood and Affect: Mood normal.        Behavior: Behavior normal.     ED Results / Procedures / Treatments   Labs (all labs ordered are listed, but only abnormal results are displayed) Labs Reviewed  CBC - Abnormal; Notable for the following components:      Result Value   MCV 78.8 (*)    MCH 25.3 (*)    All other components within normal limits  BASIC METABOLIC PANEL  TROPONIN I (HIGH SENSITIVITY)  TROPONIN I (HIGH SENSITIVITY)    EKG EKG Interpretation Date/Time:  Sunday April 02 2023 20:11:09 EST Ventricular Rate:  71 PR Interval:  165 QRS Duration:  90 QT Interval:  393 QTC Calculation: 428 R Axis:   80  Text Interpretation: Sinus rhythm Anterior infarct, old no obvious STEMI Confirmed by Richardean Canal [phone]) on 04/02/2023 8:19:21 PM  Radiology DG Chest 2  View Result Date: 04/02/2023 CLINICAL DATA:  chest pressure EXAM: CHEST - 2 VIEW COMPARISON:  None available. FINDINGS: Cardiomediastinal silhouette and pulmonary vasculature are within normal limits. Lungs are clear. IMPRESSION: No acute cardiopulmonary process. Electronically Signed   By: Acquanetta Belling M.D.   On: 04/02/2023 19:08    Procedures Procedures    CRITICAL CARE Performed by: Richardean Canal   Total critical care time: 46 minutes  Critical care time was exclusive of separately billable procedures and treating other patients.  Critical care was necessary to treat or prevent imminent or life-threatening deterioration.  Critical care was time spent personally by me on the following activities: development of treatment plan with patient and/or surrogate as well as nursing, discussions with consultants, evaluation of patient's response to treatment, examination of patient, obtaining history from patient or  surrogate, ordering and performing treatments and interventions, ordering and review of laboratory studies, ordering and review of radiographic studies, pulse oximetry and re-evaluation of patient's condition.    Medications Ordered in ED Medications  nitroGLYCERIN (NITROSTAT) SL tablet 0.4 mg (has no administration in time range)  aspirin tablet 325 mg (has no administration in time range)    ED Course/ Medical Decision Making/ A&P                                 Medical Decision Making Darren Garrett is a 48 y.o. male history of CAD status post recent stent here presenting with chest pain.  Patient has 9 out of 10 chest pain currently.  Patient is hypertensive. Patient had a recent heart catheterization with stents placed. I do not see any obvious STEMI on EKG and have low suspicion for in-stent thrombosis.  Plan to get CBC and BMP and troponin and chest x-ray.  Patient will likely need admission.  8:28 PM Troponin is 800.  Repeat EKG again does not show a STEMI.  Concern for unstable angina versus NSTEMI.  Patient was started on heparin and given aspirin and nitro.  Will consult cardiology.   8:40 PM I discussed with Dr. Orson Aloe from cardiology.  He states that elevated troponin of 800 could be normal after cardiac stent.  He agreed with IV heparin and nitro and aspirin.  He request second troponin to see if his downtrending to decide if patient will need emergent transfer or admission and transfer to [facility] when there is a bed.  10:30 pm Repeat troponin is 1900.  Concern for NSTEMI.  He also has persistent pain.  Patient is on nitro drip now. Talked to Dr. Orson Aloe from cardiology. He will see patient at [facility]. Dr. Rhunette Croft will be accepting doctor at [facility].    Amount and/or Complexity of Data Reviewed Labs: ordered. Decision-making details documented in ED Course. Radiology: ordered and independent interpretation performed. Decision-making details documented in ED Course. ECG/medicine  tests: ordered and independent interpretation performed. Decision-making details documented in ED Course.  Risk OTC drugs. Prescription drug management. Decision regarding hospitalization.    Final Clinical Impression(s) / ED Diagnoses Final diagnoses:  None    Rx / DC Orders ED Discharge Orders     None         Charlynne Pander, MD 04/02/23 2250

## 2023-04-02 NOTE — ED Notes (Signed)
 ED provider at bedside.

## 2023-04-02 NOTE — ED Provider Notes (Signed)
 Patient transferred from [facility] with NSTEMI.  Recent catheterization with stents placed in [place].  Developed chest pain and shortness of breath this morning.  Elevated troponin without acute STEMI on EKG.  He is on aspirin, heparin and nitroglycerin.  Still having chest pain. Morphine given.  Will repeat EKG and contact cardiology Dr. Orson Aloe for

## 2023-04-02 NOTE — ED Triage Notes (Signed)
 Pt states chest pressure that started today at 1100. Intermittent SOB. Denies nausea.

## 2023-04-02 NOTE — ED Notes (Signed)
 Report called to [facility] ED Charge.

## 2023-04-02 NOTE — Progress Notes (Signed)
 PHARMACY - ANTICOAGULATION CONSULT NOTE  Pharmacy Consult for Heparin Indication: chest pain/ACS  No Known Allergies  Patient Measurements: Height: 5\' 6"  (167.6 cm) Weight: 74.8 kg (165 lb) IBW/kg (Calculated) : 63.8 Heparin Dosing Weight:  74.8 kg  Vital Signs: Temp: 97.8 F (36.6 C) (03/02 1839) Temp Source: Oral (03/02 1839) BP: 180/94 (03/02 1839) Pulse Rate: 78 (03/02 1839)  Labs: Recent Labs    04/02/23 1906  HGB 13.5  HCT 42.1  PLT 322    CrCl cannot be calculated (No successful lab value found.).   Medical History: Past Medical History:  Diagnosis Date   Acid reflux    CHF (congestive heart failure) (HCC)    Diabetes mellitus without complication (HCC)    Gout    History of coronary angioplasty with insertion of stent    Hypertension     Assessment: Active Problem(s): CP, SOB  AC/Heme: Hep for CP. Hgb 13.5, Plts 322 at baseline. Known h/o CAD  Goal of Therapy:  Heparin level 0.3-0.7 units/ml Monitor platelets by anticoagulation protocol: Yes   Plan:  IV heparin 4000 unit bolus Heparin infusion at 1050 units/hr Check heparin level in 6-8 hrs. Daily HL and CBC   Semaje Kinker S. Merilynn Finland, PharmD, BCPS Clinical Staff Pharmacist Misty Stanley Stillinger 04/02/2023,8:20 PM

## 2023-04-03 ENCOUNTER — Encounter (HOSPITAL_COMMUNITY)
Admission: EM | Disposition: A | Discharge status: Home / Self Care | Payer: Self-pay | Source: Home / Self Care | Attending: Internal Medicine

## 2023-04-03 ENCOUNTER — Inpatient Hospital Stay (HOSPITAL_COMMUNITY)

## 2023-04-03 ENCOUNTER — Other Ambulatory Visit (HOSPITAL_COMMUNITY): Payer: Self-pay

## 2023-04-03 ENCOUNTER — Encounter (HOSPITAL_COMMUNITY): Payer: Self-pay | Admitting: Internal Medicine

## 2023-04-03 DIAGNOSIS — I10 Essential (primary) hypertension: Secondary | ICD-10-CM

## 2023-04-03 DIAGNOSIS — I214 Non-ST elevation (NSTEMI) myocardial infarction: Principal | ICD-10-CM | POA: Diagnosis present

## 2023-04-03 DIAGNOSIS — E118 Type 2 diabetes mellitus with unspecified complications: Secondary | ICD-10-CM

## 2023-04-03 DIAGNOSIS — Z955 Presence of coronary angioplasty implant and graft: Secondary | ICD-10-CM | POA: Diagnosis not present

## 2023-04-03 DIAGNOSIS — I2510 Atherosclerotic heart disease of native coronary artery without angina pectoris: Secondary | ICD-10-CM

## 2023-04-03 DIAGNOSIS — E785 Hyperlipidemia, unspecified: Secondary | ICD-10-CM | POA: Diagnosis present

## 2023-04-03 DIAGNOSIS — Z7984 Long term (current) use of oral hypoglycemic drugs: Secondary | ICD-10-CM | POA: Diagnosis not present

## 2023-04-03 DIAGNOSIS — K219 Gastro-esophageal reflux disease without esophagitis: Secondary | ICD-10-CM | POA: Diagnosis present

## 2023-04-03 DIAGNOSIS — I222 Subsequent non-ST elevation (NSTEMI) myocardial infarction: Secondary | ICD-10-CM | POA: Diagnosis present

## 2023-04-03 DIAGNOSIS — I509 Heart failure, unspecified: Secondary | ICD-10-CM | POA: Diagnosis present

## 2023-04-03 DIAGNOSIS — Z79899 Other long term (current) drug therapy: Secondary | ICD-10-CM | POA: Diagnosis not present

## 2023-04-03 DIAGNOSIS — Z794 Long term (current) use of insulin: Secondary | ICD-10-CM | POA: Diagnosis not present

## 2023-04-03 DIAGNOSIS — I2109 ST elevation (STEMI) myocardial infarction involving other coronary artery of anterior wall: Secondary | ICD-10-CM | POA: Diagnosis present

## 2023-04-03 DIAGNOSIS — Y831 Surgical operation with implant of artificial internal device as the cause of abnormal reaction of the patient, or of later complication, without mention of misadventure at the time of the procedure: Secondary | ICD-10-CM | POA: Diagnosis present

## 2023-04-03 DIAGNOSIS — I110 Hypertensive heart disease with heart failure: Secondary | ICD-10-CM | POA: Diagnosis present

## 2023-04-03 DIAGNOSIS — T82855A Stenosis of coronary artery stent, initial encounter: Secondary | ICD-10-CM | POA: Diagnosis present

## 2023-04-03 DIAGNOSIS — Z7982 Long term (current) use of aspirin: Secondary | ICD-10-CM | POA: Diagnosis not present

## 2023-04-03 HISTORY — PX: LEFT HEART CATH AND CORONARY ANGIOGRAPHY: CATH118249

## 2023-04-03 LAB — ECHOCARDIOGRAM COMPLETE
AR max vel: 2.2 cm2
AV Area VTI: 2.34 cm2
AV Area mean vel: 2.35 cm2
AV Mean grad: 6 mmHg
AV Peak grad: 10.4 mmHg
Ao pk vel: 1.61 m/s
Area-P 1/2: 2.73 cm2
Height: 66 in
S' Lateral: 3.3 cm
Weight: 2640 [oz_av]

## 2023-04-03 LAB — HEPATIC FUNCTION PANEL
ALT: 39 U/L (ref 0–44)
AST: 60 U/L — ABNORMAL HIGH (ref 15–41)
Albumin: 3.6 g/dL (ref 3.5–5.0)
Alkaline Phosphatase: 56 U/L (ref 38–126)
Bilirubin, Direct: 0.1 mg/dL (ref 0.0–0.2)
Indirect Bilirubin: 0.5 mg/dL (ref 0.3–0.9)
Total Bilirubin: 0.6 mg/dL (ref 0.0–1.2)
Total Protein: 6.6 g/dL (ref 6.5–8.1)

## 2023-04-03 LAB — HIV ANTIBODY (ROUTINE TESTING W REFLEX): HIV Screen 4th Generation wRfx: NONREACTIVE

## 2023-04-03 LAB — CBC
HCT: 39.2 % (ref 39.0–52.0)
Hemoglobin: 13.1 g/dL (ref 13.0–17.0)
MCH: 25.7 pg — ABNORMAL LOW (ref 26.0–34.0)
MCHC: 33.4 g/dL (ref 30.0–36.0)
MCV: 76.9 fL — ABNORMAL LOW (ref 80.0–100.0)
Platelets: 309 10*3/uL (ref 150–400)
RBC: 5.1 MIL/uL (ref 4.22–5.81)
RDW: 13.3 % (ref 11.5–15.5)
WBC: 10 10*3/uL (ref 4.0–10.5)
nRBC: 0 % (ref 0.0–0.2)

## 2023-04-03 LAB — BASIC METABOLIC PANEL
Anion gap: 13 (ref 5–15)
BUN: 8 mg/dL (ref 6–20)
CO2: 21 mmol/L — ABNORMAL LOW (ref 22–32)
Calcium: 9 mg/dL (ref 8.9–10.3)
Chloride: 102 mmol/L (ref 98–111)
Creatinine, Ser: 0.72 mg/dL (ref 0.61–1.24)
GFR, Estimated: >60 mL/min (ref >60)
Glucose, Bld: 160 mg/dL — ABNORMAL HIGH (ref 70–99)
Potassium: 3.1 mmol/L — ABNORMAL LOW (ref 3.5–5.1)
Sodium: 136 mmol/L (ref 135–145)

## 2023-04-03 LAB — HEMOGLOBIN A1C
Hgb A1c MFr Bld: 7.8 % — ABNORMAL HIGH (ref 4.8–5.6)
Mean Plasma Glucose: 177.16 mg/dL

## 2023-04-03 LAB — TROPONIN I (HIGH SENSITIVITY): Troponin I (High Sensitivity): 8513 ng/L (ref <18)

## 2023-04-03 LAB — LIPID PANEL
Cholesterol: 167 mg/dL (ref 0–200)
HDL: 40 mg/dL — ABNORMAL LOW (ref >40)
LDL Cholesterol: 94 mg/dL (ref 0–99)
Total CHOL/HDL Ratio: 4.2 ratio
Triglycerides: 167 mg/dL — ABNORMAL HIGH (ref <150)
VLDL: 33 mg/dL (ref 0–40)

## 2023-04-03 LAB — GLUCOSE, CAPILLARY
Glucose-Capillary: 144 mg/dL — ABNORMAL HIGH (ref 70–99)
Glucose-Capillary: 227 mg/dL — ABNORMAL HIGH (ref 70–99)
Glucose-Capillary: 153 mg/dL — ABNORMAL HIGH (ref 70–99)
Glucose-Capillary: 184 mg/dL — ABNORMAL HIGH (ref 70–99)

## 2023-04-03 LAB — HEPARIN LEVEL (UNFRACTIONATED): Heparin Unfractionated: 0.44 [IU]/mL (ref 0.30–0.70)

## 2023-04-03 MED ORDER — FENTANYL CITRATE (PF) 100 MCG/2ML IJ SOLN
INTRAMUSCULAR | Status: AC
Start: 2023-04-03 — End: ?
  Filled 2023-04-03: qty 2

## 2023-04-03 MED ORDER — OXYCODONE HCL 5 MG PO TABS
5.0000 mg | ORAL_TABLET | ORAL | Status: DC | PRN
Start: 2023-04-03 — End: 2023-04-04
  Administered 2023-04-03: 10 mg via ORAL
  Filled 2023-04-03: qty 2

## 2023-04-03 MED ORDER — SODIUM CHLORIDE 0.9% FLUSH: 3.0000 mL | INTRAVENOUS | Status: DC | PRN

## 2023-04-03 MED ORDER — ONDANSETRON HCL 4 MG/2ML IJ SOLN: 4.0000 mg | Freq: Four times a day (QID) | INTRAMUSCULAR | Status: DC | PRN

## 2023-04-03 MED ORDER — ASPIRIN 81 MG PO TBEC
81.0000 mg | DELAYED_RELEASE_TABLET | Freq: Every day | ORAL | Status: DC
  Administered 2023-04-04: 81 mg via ORAL
  Filled 2023-04-03: qty 1

## 2023-04-03 MED ORDER — INSULIN ASPART 100 UNIT/ML IJ SOLN: 0.0000 [IU] | Freq: Every day | INTRAMUSCULAR | Status: DC

## 2023-04-03 MED ORDER — HEPARIN (PORCINE) IN NACL 1000-0.9 UT/500ML-% IV SOLN
INTRAVENOUS | Status: DC | PRN
  Administered 2023-04-03 (×2): 500 mL

## 2023-04-03 MED ORDER — PANTOPRAZOLE SODIUM 40 MG PO TBEC
40.0000 mg | DELAYED_RELEASE_TABLET | Freq: Every day | ORAL | Status: DC
  Administered 2023-04-04: 40 mg via ORAL
  Filled 2023-04-03 (×2): qty 1

## 2023-04-03 MED ORDER — ASPIRIN 81 MG PO CHEW
81.0000 mg | CHEWABLE_TABLET | ORAL | Status: AC
  Administered 2023-04-03: 81 mg via ORAL
  Filled 2023-04-03: qty 1

## 2023-04-03 MED ORDER — ACETAMINOPHEN 325 MG PO TABS: 650.0000 mg | ORAL_TABLET | ORAL | Status: DC | PRN

## 2023-04-03 MED ORDER — PERFLUTREN LIPID MICROSPHERE
1.0000 mL | INTRAVENOUS | Status: AC | PRN
  Administered 2023-04-03: 3 mL via INTRAVENOUS

## 2023-04-03 MED ORDER — LABETALOL HCL 5 MG/ML IV SOLN: 10.0000 mg | INTRAVENOUS | Status: AC | PRN

## 2023-04-03 MED ORDER — HEPARIN SODIUM (PORCINE) 1000 UNIT/ML IJ SOLN
INTRAMUSCULAR | Status: AC
  Filled 2023-04-03: qty 10

## 2023-04-03 MED ORDER — METOPROLOL TARTRATE 12.5 MG HALF TABLET
12.5000 mg | ORAL_TABLET | Freq: Two times a day (BID) | ORAL | Status: DC
  Administered 2023-04-03 – 2023-04-04 (×3): 12.5 mg via ORAL
  Filled 2023-04-03 (×4): qty 1

## 2023-04-03 MED ORDER — IOHEXOL 350 MG/ML SOLN
INTRAVENOUS | Status: DC | PRN
  Administered 2023-04-03: 35 mL

## 2023-04-03 MED ORDER — VERAPAMIL HCL 2.5 MG/ML IV SOLN
INTRAVENOUS | Status: AC
  Filled 2023-04-03: qty 2

## 2023-04-03 MED ORDER — POTASSIUM CHLORIDE CRYS ER 20 MEQ PO TBCR
40.0000 meq | EXTENDED_RELEASE_TABLET | ORAL | Status: AC
  Administered 2023-04-03 (×2): 40 meq via ORAL
  Filled 2023-04-03 (×2): qty 2

## 2023-04-03 MED ORDER — HYDRALAZINE HCL 20 MG/ML IJ SOLN: 10.0000 mg | INTRAMUSCULAR | Status: AC | PRN

## 2023-04-03 MED ORDER — MIDAZOLAM HCL 2 MG/2ML IJ SOLN
INTRAMUSCULAR | Status: DC | PRN
  Administered 2023-04-03: 1 mg via INTRAVENOUS

## 2023-04-03 MED ORDER — LORAZEPAM 1 MG PO TABS: 1.0000 mg | ORAL_TABLET | Freq: Three times a day (TID) | ORAL | Status: DC | PRN

## 2023-04-03 MED ORDER — MIDAZOLAM HCL 2 MG/2ML IJ SOLN
INTRAMUSCULAR | Status: AC
  Filled 2023-04-03: qty 2

## 2023-04-03 MED ORDER — VERAPAMIL HCL 2.5 MG/ML IV SOLN
INTRAVENOUS | Status: DC | PRN
  Administered 2023-04-03: 10 mL via INTRA_ARTERIAL

## 2023-04-03 MED ORDER — LIDOCAINE HCL (PF) 1 % IJ SOLN
INTRAMUSCULAR | Status: DC | PRN
  Administered 2023-04-03: 2 mL

## 2023-04-03 MED ORDER — ATORVASTATIN CALCIUM 80 MG PO TABS
80.0000 mg | ORAL_TABLET | Freq: Every day | ORAL | Status: DC
  Administered 2023-04-03: 80 mg via ORAL
  Filled 2023-04-03: qty 1

## 2023-04-03 MED ORDER — INSULIN ASPART 100 UNIT/ML IJ SOLN
0.0000 [IU] | Freq: Three times a day (TID) | INTRAMUSCULAR | Status: DC
  Administered 2023-04-03: 2 [IU] via SUBCUTANEOUS
  Administered 2023-04-03: 3 [IU] via SUBCUTANEOUS
  Administered 2023-04-04 (×2): 2 [IU] via SUBCUTANEOUS

## 2023-04-03 MED ORDER — NITROGLYCERIN 0.4 MG SL SUBL: 0.4000 mg | SUBLINGUAL_TABLET | SUBLINGUAL | Status: DC | PRN

## 2023-04-03 MED ORDER — TICAGRELOR 90 MG PO TABS
90.0000 mg | ORAL_TABLET | Freq: Two times a day (BID) | ORAL | Status: DC
  Administered 2023-04-03 – 2023-04-04 (×3): 90 mg via ORAL
  Filled 2023-04-03 (×4): qty 1

## 2023-04-03 MED ORDER — SODIUM CHLORIDE 0.9% FLUSH
3.0000 mL | Freq: Two times a day (BID) | INTRAVENOUS | Status: DC
  Administered 2023-04-03 – 2023-04-04 (×2): 3 mL via INTRAVENOUS

## 2023-04-03 MED ORDER — GABAPENTIN 300 MG PO CAPS
300.0000 mg | ORAL_CAPSULE | Freq: Two times a day (BID) | ORAL | Status: DC
  Administered 2023-04-03 – 2023-04-04 (×3): 300 mg via ORAL
  Filled 2023-04-03 (×4): qty 1

## 2023-04-03 MED ORDER — LIDOCAINE HCL (PF) 1 % IJ SOLN
INTRAMUSCULAR | Status: AC
  Filled 2023-04-03: qty 30

## 2023-04-03 MED ORDER — SODIUM CHLORIDE 0.9 % IV SOLN: INTRAVENOUS | Status: DC

## 2023-04-03 MED ORDER — SODIUM CHLORIDE 0.9 % IV SOLN: INTRAVENOUS | Status: AC

## 2023-04-03 MED ORDER — SODIUM CHLORIDE 0.9 % IV SOLN: 250.0000 mL | INTRAVENOUS | Status: DC | PRN

## 2023-04-03 MED ORDER — THIAMINE MONONITRATE 100 MG PO TABS
100.0000 mg | ORAL_TABLET | Freq: Every day | ORAL | Status: DC
  Administered 2023-04-04: 100 mg via ORAL
  Filled 2023-04-03 (×2): qty 1

## 2023-04-03 MED ORDER — ASPIRIN 81 MG PO CHEW: 81.0000 mg | CHEWABLE_TABLET | Freq: Every day | ORAL | Status: DC

## 2023-04-03 MED ORDER — HEPARIN SODIUM (PORCINE) 1000 UNIT/ML IJ SOLN
INTRAMUSCULAR | Status: DC | PRN
  Administered 2023-04-03: 5000 [IU] via INTRAVENOUS

## 2023-04-03 MED ORDER — FENTANYL CITRATE (PF) 100 MCG/2ML IJ SOLN
INTRAMUSCULAR | Status: DC | PRN
  Administered 2023-04-03: 25 ug via INTRAVENOUS

## 2023-04-03 NOTE — TOC CM/SW Note (Signed)
 Transition of Care [facility]) - Inpatient Brief Assessment   Patient Details  Name: Darren Garrett MRN: [id number] Date of Birth: [birth year]  Transition of Care [facility]) CM/SW Contact:    Leone Haven, RN Phone Number: 04/03/2023, 4:53 PM   Clinical Narrative: From home with mom, has PCP  in [place] but will be living here now so will need a new PCP and insurance on file, states has no HH services in place at this time or DME at home.  States family member will transport them home at [employer] and family is support system, states he is ok with [facility] pharmacy filling his medications.  Pta self ambulatory .   Transition of Care Asessment: Insurance and Status: Insurance coverage has been reviewed Patient has primary care physician: Yes Home environment has been reviewed: home wiht mom Prior level of function:: indep Prior/Current Home Services: No current home services Social Drivers of Health Review: SDOH reviewed no interventions necessary Readmission risk has been reviewed: Yes Transition of care needs: transition of care needs identified, TOC will continue to follow

## 2023-04-03 NOTE — ED Notes (Signed)
 CCMD called, pt placed on central monitoring

## 2023-04-03 NOTE — Progress Notes (Signed)
 Echocardiogram 2D Echocardiogram has been performed.  Darren Garrett 04/03/2023, 1:55 PM

## 2023-04-03 NOTE — ED Notes (Signed)
 Cath lab zoll pads placed on pt at this time. Cath lab staff at bedside to transport.

## 2023-04-03 NOTE — Progress Notes (Addendum)
   Patient was admitted overnight for a NSTEMI. He has a history of CAD with recent PCI/ DES 2 weeks ago in [place], [place]. He presented with sudden onset of severe chest pain yesterday morning while driving to work. EKG showed no acute ischemic changes. High-sensitivity troponin 808 >> 1,961. He was started on IV Heparin and IV Nitroglycerin. This morning he had recurrent chest pain. EKG showed some evolving T wave changes and high-sensitivity troponin increased to 8,513.  At the time of this evaluation, patient's chest pain has improved after IV Nitroglycerin was increased. He currently ranks the pain as 1-2/10. He appear comfortable. He states he recently had a stent placed at [facility] in [place], [place].   No adventitious heart or lung sounds on exam. He does have resolving ecchymosis on left forearm from recent cardiac catheterization.  We are going to expedite cardiac catheterization this morning giving ongoing chest pain with evolving EKG changes and increasing troponin. The patient understands that risks include but are not limited to stroke (1 in 1000), death (1 in 1000), kidney failure [usually temporary] (1 in 500), bleeding (1 in 200), allergic reaction [possibly serious] (1 in 200), and agrees to proceed.   Corrin Parker, PA-C 04/03/2023 7:13 AM   Patient seen, examined. Available data reviewed. Agree with findings, assessment, and plan as outlined by Marjie Skiff, PA-C.  The patient continues to have mild chest discomfort on high-dose IV nitroglycerin.  I was able to review his cardiac catheterization and PCI report from [place].  It looks like he underwent 2 recent procedures, the first involving stenting of the left main into the LAD, mid LAD, and proximal obtuse marginal branch.  He then went back into the hospital March 27, 2023 with recurrent ACS and underwent PCI of the mid LAD and diagonal with stenting of both vessels.  He did okay for about a week until he  developed recurrent chest discomfort yesterday and has ruled in for non-STEMI with increasing troponins and progressive chest pain.  He presents for repeat cardiac catheterization and possible PCI.  Full informed consent is obtained as above.  Further plans pending cardiac catheterization results.  Tonny Bollman, M.D. 04/03/2023 7:49 AM

## 2023-04-03 NOTE — Progress Notes (Signed)
 PHARMACY - ANTICOAGULATION CONSULT NOTE  Pharmacy Consult for heparin Indication: chest pain/ACS  Labs: Recent Labs    04/02/23 1906 04/02/23 2041 04/03/23 0212  HGB 13.5  --   --   HCT 42.1  --   --   PLT 322  --   --   HEPARINUNFRC  --   --  0.44  CREATININE 0.94  --   --   TROPONINIHS 808* 1,961*  --    Assessment/Plan:  48yo male therapeutic on heparin with initial dosing for ACS after recent stent placement. Will continue infusion at current rate of 1050 units/hr and confirm stable with additional level.  Vernard Gambles, PharmD, BCPS 04/03/2023 3:05 AM

## 2023-04-03 NOTE — Progress Notes (Signed)
 TR band completely removed. No bleeding or hematoma noted. VSS. Post activity and precautions explained; verbalized understanding. Patient transferred to 3E03; Care released.Darren Garrett

## 2023-04-03 NOTE — H&P (Addendum)
 Cardiology Admission History and Physical   Patient ID: Darren Garrett MRN: [id number]; DOB: 1975/12/29   Admission date: 04/02/2023  PCP:  System, Provider Not In   [facility] Providers Cardiologist:  None        Chief Complaint:  chest pain  Patient Profile:   Darren Garrett is a 48 y.o. male with a significant past medical history of coronary artery disease status post drug-eluting stents reportedly 2 weeks ago in [facility] (documentation of anatomy unknown), hypertension, insulin dependent diabetes, and gastro-esophageal reflux disease who is being seen 04/03/2023 for the evaluation of chest pain concerning for NSTEMI-ACS.  History of Present Illness:   Darren Garrett reports around 11 am this morning while driving to work, he suddenly noticed chest pain that initially went throughout his chest.  The pain is described as squeezing periodically, pressure-like, and sometimes burning.  His initial pain was 10/10 in intensity.  He decided not to go to work today due to the chest pain.  He took his medications for his coronary artery disease per patient this morning.  His chest pain eventually progressed to the center of his chest, but due to ongoing intense chest pain, he decided to drive himself to [facility] Emergency Department.  He denies any other associated symptoms such as sweating, nausea, dizziness, or palpitations.  He now reports 4/10 chest pain that improved some with medications given at the other emergency department.  Of note, patient states that he was admitted to a hospital in [facility] around two weeks ago due to chest pain.  He states he had two stents placed in his heart.  After stents were placed, he reports the chest pain totally resolved and didn't have any additional chest pain until today.  He states that he has been taking all of his new medications for his coronary artery disease as prescribed and has not missed any medications.       When  patient presented to [facility], his initial ECG was without STEMI or concerns for ischemia.  Initial HS-troponin was 808 ng/L and went to 1,961 ng/L.  His initial blood pressure was 180/94 mmHg.  He was given high-dose aspirin and initiated on heparin drip.  Due to concern for NSTEMI-ACS, patient was transferred from [facility] Emergency Department to [facility] Emergency Department for immediate cardiology management.    Past Medical History:  Diagnosis Date   Acid reflux    CHF (congestive heart failure) (HCC)    Diabetes mellitus without complication (HCC)    Gout    History of coronary angioplasty with insertion of stent    Hypertension       Medications Prior to Admission: Prior to Admission medications   Not on File     Allergies:   No Known Allergies  Social History:   Social History   Socioeconomic History   Marital status: Single    Spouse name: Not on file   Number of children: Not on file   Years of education: Not on file   Highest education level: Not on file  Occupational History   Not on file  Tobacco Use   Smoking status: Never   Smokeless tobacco: Never  Substance and Sexual Activity   Alcohol use: Not on file   Drug use: Not on file   Sexual activity: Not on file  Other Topics Concern   Not on file  Social History Narrative   Not on file   Social Drivers of  Health   Financial Resource Strain: Not on file  Food Insecurity: Not on file  Transportation Needs: Not on file  Physical Activity: Not on file  Stress: Not on file  Social Connections: Not on file  Intimate Partner Violence: Not on file    Family History:   The patient's family history is not on file.    ROS:  Please see the history of present illness.  All other ROS reviewed and negative.     Physical Exam/Data:   Vitals:   04/02/23 2100 04/02/23 2241 04/02/23 2335 04/02/23 2345  BP: (!) 151/85 (!) 169/81 (!) 166/94 (!) 156/81  Pulse: 78 77 72 73  Resp: 16 (!) 25 16  (!) 26  Temp:  98.2 F (36.8 C) 98.2 F (36.8 C)   TempSrc:  Oral Oral   SpO2: 100% 100% 100% 100%  Weight:      Height:       No intake or output data in the 24 hours ending 04/03/23 0047    04/02/2023    6:37 PM  Last 3 Weights  Weight (lbs) 165 lb  Weight (kg) 74.844 kg     Body mass index is 26.63 kg/m.  General:  Well nourished, well developed, in no acute distress HEENT: normal Neck: no JVD Vascular: No carotid bruits; Distal pulses 2+ bilaterally   Cardiac:  normal S1, S2; RRR; no murmur  Lungs:  clear to auscultation bilaterally, no wheezing, rhonchi or rales  Abd: soft, nontender, no hepatomegaly  Ext: no edema Musculoskeletal:  No deformities, BUE and BLE strength normal and equal Skin: warm and dry  Neuro:  CNs 2-12 intact, no focal abnormalities noted Psych:  Normal affect    EKG:  The ECG that was done 04/02/23 (10:51 pm) was personally reviewed and demonstrates sinus rhythm; cannot rule out septal infarct; abnormal ECG  Relevant CV Studies: --Self reported stents placed to coronary arteries 03/29/23.    Laboratory Data:  High Sensitivity Troponin:   Recent Labs  Lab 04/02/23 1906 04/02/23 2041  TROPONINIHS 808* 1,961*      Chemistry Recent Labs  Lab 04/02/23 1906  NA 135  K 3.4*  CL 103  CO2 22  GLUCOSE 198*  BUN 15  CREATININE 0.94  CALCIUM 8.7*  GFRNONAA >60  ANIONGAP 10    No results for input(s): "PROT", "ALBUMIN", "AST", "ALT", "ALKPHOS", "BILITOT" in the last 168 hours. Lipids No results for input(s): "CHOL", "TRIG", "HDL", "LABVLDL", "LDLCALC", "CHOLHDL" in the last 168 hours. Hematology Recent Labs  Lab 04/02/23 1906  WBC 9.2  RBC 5.34  HGB 13.5  HCT 42.1  MCV 78.8*  MCH 25.3*  MCHC 32.1  RDW 13.3  PLT 322   Thyroid No results for input(s): "TSH", "FREET4" in the last 168 hours. BNPNo results for input(s): "BNP", "PROBNP" in the last 168 hours.  DDimer No results for input(s): "DDIMER" in the last 168  hours.   Radiology/Studies:  DG Chest 2 View Result Date: 04/02/2023 CLINICAL DATA:  chest pressure EXAM: CHEST - 2 VIEW COMPARISON:  None available. FINDINGS: Cardiomediastinal silhouette and pulmonary vasculature are within normal limits. Lungs are clear. IMPRESSION: No acute cardiopulmonary process. Electronically Signed   By: Acquanetta Belling M.D.   On: 04/02/2023 19:08     Assessment and Plan:   Chest pain/NSTEMI-ACS: Patient with persistent chest pain in the setting of a rising HS-troponin, consistent with NSTEMI-ACS.  He has ongoing chest pian that has improved with IV nitroglycerin and morphine provided earlier.  He is hemodynamically stable.  Patient reports compliance with dual antiplatelet therapy prior (aspirin and ticagrelor).   --Arrange for coronary angiography first thing in the morning, earlier if unable to get chest pain controlled. --Arrange for echocardiogram in the morning. --Continue dual antiplatelet therapy.   --Continue heparin drip and titrate up nitroglycerin drip as needed.   --Start IV fluids at 75 ml/hr.    Hypertension: Primary hypertension initially elevated upon admission now improved with nitroglycerin drip.  Patient is on appropriate anti-hypertension medications at home.   --Continue home metoprolol tartrate 25 mg twice daily while holding the other blood pressure medications given nitroglycerin drip.    Type II diabetes: Insulin-dependent diabetes.   --Start inpatient glucose protocol.    Risk Assessment/Risk Scores:    TIMI Risk Score for Unstable Angina or Non-ST Elevation MI:   The patient's TIMI risk score is 5, which indicates a 26% risk of all cause mortality, new or recurrent myocardial infarction or need for urgent revascularization in the next 14 days.      Code Status: Full Code  Severity of Illness: The appropriate patient status for this patient is INPATIENT. Inpatient status is judged to be reasonable and necessary in order to provide  the required intensity of service to ensure the patient's safety. The patient's presenting symptoms, physical exam findings, and initial radiographic and laboratory data in the context of their chronic comorbidities is felt to place them at high risk for further clinical deterioration. Furthermore, it is not anticipated that the patient will be medically stable for discharge from the hospital within 2 midnights of admission.   * I certify that at the point of admission it is my clinical judgment that the patient will require inpatient hospital care spanning beyond 2 midnights from the point of admission due to high intensity of service, high risk for further deterioration and high frequency of surveillance required.*   For questions or updates, please contact  [facility] Please consult [email or web address] for contact info under     Signed, Judie Grieve, MD  04/03/2023 12:47 AM

## 2023-04-03 NOTE — Plan of Care (Signed)

## 2023-04-03 NOTE — ED Notes (Signed)
 Pt walked to the restroom with steady gait. Nurse informed pt should use the bedside toilet due to IV infusions, and dx. Pt refused and wanted to walk to the restroom.

## 2023-04-03 NOTE — ED Notes (Signed)
Pt to cath lab at this time with cath lab staff

## 2023-04-04 ENCOUNTER — Encounter (HOSPITAL_COMMUNITY): Payer: Self-pay | Admitting: Emergency Medicine

## 2023-04-04 ENCOUNTER — Other Ambulatory Visit (HOSPITAL_COMMUNITY): Payer: Self-pay

## 2023-04-04 ENCOUNTER — Telehealth: Payer: Self-pay | Admitting: Cardiology

## 2023-04-04 DIAGNOSIS — I2510 Atherosclerotic heart disease of native coronary artery without angina pectoris: Secondary | ICD-10-CM | POA: Diagnosis not present

## 2023-04-04 DIAGNOSIS — E785 Hyperlipidemia, unspecified: Secondary | ICD-10-CM | POA: Insufficient documentation

## 2023-04-04 DIAGNOSIS — I214 Non-ST elevation (NSTEMI) myocardial infarction: Secondary | ICD-10-CM | POA: Diagnosis not present

## 2023-04-04 LAB — CBC
HCT: 43.8 % (ref 39.0–52.0)
Hemoglobin: 14.6 g/dL (ref 13.0–17.0)
MCH: 25.3 pg — ABNORMAL LOW (ref 26.0–34.0)
MCHC: 33.3 g/dL (ref 30.0–36.0)
MCV: 75.8 fL — ABNORMAL LOW (ref 80.0–100.0)
Platelets: 349 K/uL (ref 150–400)
RBC: 5.78 MIL/uL (ref 4.22–5.81)
RDW: 13.6 % (ref 11.5–15.5)
WBC: 10.8 K/uL — ABNORMAL HIGH (ref 4.0–10.5)
nRBC: 0 % (ref 0.0–0.2)

## 2023-04-04 LAB — BASIC METABOLIC PANEL WITH GFR
Anion gap: 7 (ref 5–15)
BUN: 10 mg/dL (ref 6–20)
CO2: 23 mmol/L (ref 22–32)
Calcium: 9.1 mg/dL (ref 8.9–10.3)
Chloride: 106 mmol/L (ref 98–111)
Creatinine, Ser: 0.85 mg/dL (ref 0.61–1.24)
GFR, Estimated: >60 mL/min
Glucose, Bld: 162 mg/dL — ABNORMAL HIGH (ref 70–99)
Potassium: 3.9 mmol/L (ref 3.5–5.1)
Sodium: 136 mmol/L (ref 135–145)

## 2023-04-04 LAB — MAGNESIUM: Magnesium: 2 mg/dL (ref 1.7–2.4)

## 2023-04-04 LAB — GLUCOSE, CAPILLARY
Glucose-Capillary: 140 mg/dL — ABNORMAL HIGH (ref 70–99)
Glucose-Capillary: 131 mg/dL — ABNORMAL HIGH (ref 70–99)

## 2023-04-04 LAB — LIPOPROTEIN A (LPA): Lipoprotein (a): 104 nmol/L — ABNORMAL HIGH (ref <75.0)

## 2023-04-04 MED ORDER — NITROGLYCERIN 0.4 MG SL SUBL
0.4000 mg | SUBLINGUAL_TABLET | SUBLINGUAL | 2 refills | Status: DC | PRN
  Filled 2023-04-04: qty 25, 7d supply, fill #0

## 2023-04-04 MED ORDER — DAPAGLIFLOZIN PROPANEDIOL 10 MG PO TABS
10.0000 mg | ORAL_TABLET | Freq: Every day | ORAL | Status: DC
  Administered 2023-04-04: 10 mg via ORAL
  Filled 2023-04-04: qty 1

## 2023-04-04 MED ORDER — DAPAGLIFLOZIN PROPANEDIOL 10 MG PO TABS
10.0000 mg | ORAL_TABLET | Freq: Every day | ORAL | 0 refills | Status: DC
  Filled 2023-04-04: qty 30, 30d supply, fill #0

## 2023-04-04 MED ORDER — ISOSORBIDE MONONITRATE ER 30 MG PO TB24
30.0000 mg | ORAL_TABLET | Freq: Every day | ORAL | Status: DC
  Administered 2023-04-04: 30 mg via ORAL
  Filled 2023-04-04: qty 1

## 2023-04-04 NOTE — Plan of Care (Signed)

## 2023-04-04 NOTE — Discharge Summary (Addendum)
 Discharge Summary    Patient ID: Darren Garrett MRN: [id number]; DOB: [birth year]  Admit date: 04/02/2023 Discharge date: 04/04/2023  PCP:  System, Provider Not In   [facility] Providers Cardiologist:  Darren Bollman, MD      Discharge Diagnoses    Principal Problem:   NSTEMI (non-ST elevated myocardial infarction) [facility]) Active Problems:   Type 2 diabetes mellitus with complication, with long-term current use of insulin (HCC)   Primary hypertension   Hyperlipidemia  Diagnostic Studies/Procedures    Cath: 04/03/2023    1st Diag lesion is 100% stenosed.   RPDA lesion is 50% stenosed.   1st Mrg lesion is 80% stenosed.   Prox Cx to Mid Cx lesion is 50% stenosed.   Previously placed Dist LM to Prox LAD stent of unknown type is  widely patent.   Previously placed Mid LAD stent of unknown type is  widely patent.   Previously placed Mid Cx to Dist Cx stent of unknown type is  widely patent.   There is mild left ventricular systolic dysfunction.   LV end diastolic pressure is normal.   The left ventricular ejection fraction is 50-55% by visual estimate.   Recommend uninterrupted dual antiplatelet therapy with Aspirin 81mg  daily and Ticagrelor 90mg  twice daily for a minimum of 12 months (ACS-Class I recommendation).   1.  Patent left main with nonobstructive plaquing, with patent stent extending from the left main into the proximal LAD 2.  Patent LAD stents with total occlusion of the first diagonal stent (small caliber diagonal) 3.  Patent circumflex with a patent mid to distal vessel stent, severe distal OM branch stenosis not suitable for PCI due to small caliber vessel. 4.  Widely patent RCA with mild to moderate PDA stenosis (large dominant vessel) 5.  Mild segmental LV dysfunction with moderate hypokinesis of the distal anterolateral wall, LVEF estimated at 50 to 55%   Recommendations: The patient has had ongoing chest discomfort since 11 AM yesterday (approximately 20  hours), now chest pain-free.  I suspect he has sustained an diagonal territory infarct with very limited benefit of late intervention.  This appears to have supplied a small area of myocardium.  Recommend medical therapy.  LVEDP is normal and he appears to have only mild LV dysfunction.  Diagnostic Dominance: Right   Echo: 04/03/2023  IMPRESSIONS     1. Left ventricular ejection fraction, by estimation, is 60 to 65%. The  left ventricle has normal function. The left ventricle has no regional  wall motion abnormalities. There is mild concentric left ventricular  hypertrophy. Left ventricular diastolic  parameters are consistent with Grade I diastolic dysfunction (impaired  relaxation).   2. Right ventricular systolic function is normal. The right ventricular  size is normal.   3. The mitral valve is normal in structure. No evidence of mitral valve  regurgitation. No evidence of mitral stenosis.   4. The aortic valve is tricuspid. There is mild calcification of the  aortic valve. Aortic valve regurgitation is trivial. Aortic valve  sclerosis/calcification is present, without any evidence of aortic  stenosis.   5. The inferior vena cava is normal in size with greater than 50%  respiratory variability, suggesting right atrial pressure of 3 mmHg.   FINDINGS   Left Ventricle: Left ventricular ejection fraction, by estimation, is 60  to 65%. The left ventricle has normal function. The left ventricle has no  regional wall motion abnormalities. Definity contrast agent was given IV  to delineate the left  ventricular   endocardial borders. The left ventricular internal cavity size was normal  in size. There is mild concentric left ventricular hypertrophy. Left  ventricular diastolic parameters are consistent with Grade I diastolic  dysfunction (impaired relaxation).   Right Ventricle: The right ventricular size is normal. No increase in  right ventricular wall thickness. Right ventricular  systolic function is  normal.   Left Atrium: Left atrial size was normal in size.   Right Atrium: Right atrial size was normal in size.   Pericardium: There is no evidence of pericardial effusion.   Mitral Valve: The mitral valve is normal in structure. No evidence of  mitral valve regurgitation. No evidence of mitral valve stenosis.   Tricuspid Valve: The tricuspid valve is normal in structure. Tricuspid  valve regurgitation is not demonstrated. No evidence of tricuspid  stenosis.   Aortic Valve: The aortic valve is tricuspid. There is mild calcification  of the aortic valve. Aortic valve regurgitation is trivial. Aortic valve  sclerosis/calcification is present, without any evidence of aortic  stenosis. Aortic valve mean gradient  measures 6.0 mmHg. Aortic valve peak gradient measures 10.4 mmHg. Aortic  valve area, by VTI measures 2.34 cm.   Pulmonic Valve: The pulmonic valve was normal in structure. Pulmonic valve  regurgitation is not visualized. No evidence of pulmonic stenosis.   Aorta: The aortic root is normal in size and structure.   Venous: The inferior vena cava is normal in size with greater than 50%  respiratory variability, suggesting right atrial pressure of 3 mmHg.   IAS/Shunts: No atrial level shunt detected by color flow Doppler.   _____________   History of Present Illness     Darren Garrett is a 48 y.o. male with a significant past medical history of coronary artery disease status post drug-eluting stents reportedly 2 weeks ago in [facility] (documentation of anatomy unknown), hypertension, insulin dependent diabetes, and gastro-esophageal reflux disease who was seen 04/03/2023 for the evaluation of chest pain concerning for NSTEMI-ACS. [name] reports around 11 am this morning while driving to work, he suddenly noticed chest pain that initially went throughout his chest.  The pain is described as squeezing periodically, pressure-like, and sometimes  burning.  His initial pain was 10/10 in intensity.  He decided not to go to work today due to the chest pain.  He took his medications for his coronary artery disease per patient this morning.  His chest pain eventually progressed to the center of his chest, but due to ongoing intense chest pain, he decided to drive himself to [facility] Emergency Department.  He denies any other associated symptoms such as sweating, nausea, dizziness, or palpitations.  He now reports 4/10 chest pain that improved some with medications given at the other emergency department.   Of note, patient states that he was admitted to a hospital in [facility] around two weeks ago due to chest pain.  He states he had two stents placed in his heart.  After stents were placed, he reports the chest pain totally resolved and didn't have any additional chest pain until today.  He states that he has been taking all of his new medications for his coronary artery disease as prescribed and has not missed any medications.        When patient presented to [facility], his initial ECG was without STEMI or concerns for ischemia.  Initial HS-troponin was 808 ng/L and went to 1,961 ng/L.  His initial blood pressure was 180/94 mmHg.  He was given high-dose aspirin and initiated on heparin drip.  Due to concern for NSTEMI-ACS, patient was transferred from [facility] Emergency Department to [facility] Emergency Department for immediate cardiology management.    Hospital Course     NSTEMI CAD s/p prior stenting of LM/pLAD/diag, mLAD and Lcx/OM -- recently seen in [place] and underwent cardiac cath stenting of the left main into the LAD, mid LAD, and proximal obtuse marginal branch. He then went back into the hospital March 27, 2023 with recurrent ACS and underwent PCI of the mid LAD and diagonal with stenting of both vessels.  -- presented with chest pain, hsTn 8513 and EKG showed evolving TW changes -- underwent cardiac cath 3/3  with patent LM/LADm LCx/OM stents with totally occluded 1st diag stent though small caliber vessel not suitable for late intervention. Recommendations for medical therapy. No recurrent chest pain. -- continue ASA, Brilinta, atorvastatin 80mg  daily, metoprolol 12.5mg  BID, Imdur 30mg  daily -- echo showed LVEF of 60-65%, g1dd, normal RV   HLD -- LDL 94, HDL 40 -- on atorvastatin 80mg  daily -- recently started a few weeks ago -- will need FLP/LFTs in 8 weeks    HTN -- continue metoprolol 12.5mg  BID, losartan/hydrochlorothiazide 50-12.5mg  daily (PTA meds)  DM -- Hgb A1c 7.8 -- resume metformin 1000mg  BID, add farxiga -- will stop home basaglar 2u daily with the addition of farxiga  General: Well developed, well nourished, male appearing in no acute distress. Head: Normocephalic, atraumatic.  Neck: Supple without bruits, JVD. Lungs:  Resp regular and unlabored, CTA. Heart: RRR, S1, S2, no S3, S4, or murmur; no rub. Abdomen: Soft, non-tender, non-distended with normoactive bowel sounds. No hepatomegaly. No rebound/guarding. No obvious abdominal masses. Extremities: No clubbing, cyanosis, edema. Distal pedal pulses are 2+ bilaterally. Right radial cath site stable without bruising or hematoma Neuro: Alert and oriented X 3. Moves all extremities spontaneously. Psych: Normal affect.  Patient was seen by myself and Dr. Tresa Endo and deemed stable for discharge home. Follow up arranged in the office. Medications sent to [facility] pharmacy. Educated by pharmD prior to discharge. CM arranged for PCP follow up prior to discharge.  Did the patient have an acute coronary syndrome (MI, NSTEMI, STEMI, etc) this admission?:  Yes                               AHA/ACC ACS Clinical Performance & Quality Measures: Aspirin prescribed? - Yes ADP Receptor Inhibitor (Plavix/Clopidogrel, Brilinta/Ticagrelor or Effient/Prasugrel) prescribed (includes medically managed patients)? - Yes Beta Blocker prescribed? - Yes High  Intensity Statin (Lipitor 40-80mg  or Crestor 20-40mg ) prescribed? - Yes EF assessed during THIS hospitalization? - Yes For EF <40%, was ACEI/ARB prescribed? - Not Applicable (EF >/= 40%) For EF <40%, Aldosterone Antagonist (Spironolactone or Eplerenone) prescribed? - Not Applicable (EF >/= 40%) Cardiac Rehab Phase II ordered (including medically managed patients)? Valentina Gu tomorrow to put him on the schedule for him to be okay The patient will be scheduled for a TOC follow up appointment in 10-14 days.  A message has been sent to the [facility] and Scheduling Pool at the office where the patient should be seen for follow up.  _____________  Discharge Vitals Blood pressure (!) 142/73, pulse 81, temperature 98.3 F (36.8 C), temperature source Oral, resp. rate 18, height 5\' 6"  (1.676 m), weight 75 kg, SpO2 100%.  Filed Weights   04/02/23 1837 04/04/23 0523  Weight: 74.8 kg  75 kg    Labs & Radiologic Studies    CBC Recent Labs    04/03/23 0526 04/04/23 0331  WBC 10.0 10.8*  HGB 13.1 14.6  HCT 39.2 43.8  MCV 76.9* 75.8*  PLT 309 349   Basic Metabolic Panel Recent Labs    40/98/11 0526 04/04/23 0331  NA 136 136  K 3.1* 3.9  CL 102 106  CO2 21* 23  GLUCOSE 160* 162*  BUN 8 10  CREATININE 0.72 0.85  CALCIUM 9.0 9.1  MG  --  2.0   Liver Function Tests Recent Labs    04/03/23 0526  AST 60*  ALT 39  ALKPHOS 56  BILITOT 0.6  PROT 6.6  ALBUMIN 3.6   No results for input(s): "LIPASE", "AMYLASE" in the last 72 hours. High Sensitivity Troponin:   Recent Labs  Lab 04/02/23 1906 04/02/23 2041 04/03/23 0526  TROPONINIHS 808* 1,961* 8,513*    BNP Invalid input(s): "POCBNP" D-Dimer No results for input(s): "DDIMER" in the last 72 hours. Hemoglobin A1C Recent Labs    04/03/23 0526  HGBA1C 7.8*   Fasting Lipid Panel Recent Labs    04/03/23 0526  CHOL 167  HDL 40*  LDLCALC 94  TRIG 914*  CHOLHDL 4.2   Thyroid Function Tests No results for input(s):  "TSH", "T4TOTAL", "T3FREE", "THYROIDAB" in the last 72 hours.  Invalid input(s): "FREET3" _____________  ECHOCARDIOGRAM COMPLETE Result Date: 04/03/2023    ECHOCARDIOGRAM REPORT   Patient Name:   Darren Garrett Date of Exam: 04/03/2023 Medical Rec #:  [id number]    Height:       66.0 in Accession #:    [id number]   Weight:       165.0 lb Date of Birth:  1975-10-26    BSA:          1.843 m Patient Age:    47 years     BP:           123/72 mmHg Patient Gender: M            HR:           73 bpm. Exam Location:  Inpatient Procedure: 2D Echo, Cardiac Doppler, Color Doppler and Intracardiac            Opacification Agent (Both Spectral and Color Flow Doppler were            utilized during procedure). Indications:    Nstemi  History:        Patient has no prior history of Echocardiogram examinations.                 Risk Factors:Hypertension and Diabetes.  Sonographer:    Karma Ganja Referring Phys: Colbert Ewing HENDERSON IMPRESSIONS  1. Left ventricular ejection fraction, by estimation, is 60 to 65%. The left ventricle has normal function. The left ventricle has no regional wall motion abnormalities. There is mild concentric left ventricular hypertrophy. Left ventricular diastolic parameters are consistent with Grade I diastolic dysfunction (impaired relaxation).  2. Right ventricular systolic function is normal. The right ventricular size is normal.  3. The mitral valve is normal in structure. No evidence of mitral valve regurgitation. No evidence of mitral stenosis.  4. The aortic valve is tricuspid. There is mild calcification of the aortic valve. Aortic valve regurgitation is trivial. Aortic valve sclerosis/calcification is present, without any evidence of aortic stenosis.  5. The inferior vena cava is normal in size with greater than 50% respiratory variability, suggesting right atrial pressure  of 3 mmHg. FINDINGS  Left Ventricle: Left ventricular ejection fraction, by estimation, is 60 to 65%. The left ventricle has  normal function. The left ventricle has no regional wall motion abnormalities. Definity contrast agent was given IV to delineate the left ventricular  endocardial borders. The left ventricular internal cavity size was normal in size. There is mild concentric left ventricular hypertrophy. Left ventricular diastolic parameters are consistent with Grade I diastolic dysfunction (impaired relaxation). Right Ventricle: The right ventricular size is normal. No increase in right ventricular wall thickness. Right ventricular systolic function is normal. Left Atrium: Left atrial size was normal in size. Right Atrium: Right atrial size was normal in size. Pericardium: There is no evidence of pericardial effusion. Mitral Valve: The mitral valve is normal in structure. No evidence of mitral valve regurgitation. No evidence of mitral valve stenosis. Tricuspid Valve: The tricuspid valve is normal in structure. Tricuspid valve regurgitation is not demonstrated. No evidence of tricuspid stenosis. Aortic Valve: The aortic valve is tricuspid. There is mild calcification of the aortic valve. Aortic valve regurgitation is trivial. Aortic valve sclerosis/calcification is present, without any evidence of aortic stenosis. Aortic valve mean gradient measures 6.0 mmHg. Aortic valve peak gradient measures 10.4 mmHg. Aortic valve area, by VTI measures 2.34 cm. Pulmonic Valve: The pulmonic valve was normal in structure. Pulmonic valve regurgitation is not visualized. No evidence of pulmonic stenosis. Aorta: The aortic root is normal in size and structure. Venous: The inferior vena cava is normal in size with greater than 50% respiratory variability, suggesting right atrial pressure of 3 mmHg. IAS/Shunts: No atrial level shunt detected by color flow Doppler.  LEFT VENTRICLE PLAX 2D LVIDd:         4.70 cm   Diastology LVIDs:         3.30 cm   LV e' medial:    7.07 cm/s LV PW:         1.00 cm   LV E/e' medial:  11.1 LV IVS:        1.00 cm   LV e'  lateral:   9.79 cm/s LVOT diam:     2.00 cm   LV E/e' lateral: 8.0 LV SV:         78 LV SV Index:   42 LVOT Area:     3.14 cm  RIGHT VENTRICLE RV Basal diam:  3.40 cm RV S prime:     13.50 cm/s TAPSE (M-mode): 2.3 cm LEFT ATRIUM             Index        RIGHT ATRIUM           Index LA diam:        4.20 cm 2.28 cm/m   RA Area:     11.70 cm LA Vol (A2C):   59.1 ml 32.07 ml/m  RA Volume:   23.20 ml  12.59 ml/m LA Vol (A4C):   37.9 ml 20.57 ml/m LA Biplane Vol: 47.3 ml 25.67 ml/m  AORTIC VALVE AV Area (Vmax):    2.20 cm AV Area (Vmean):   2.35 cm AV Area (VTI):     2.34 cm AV Vmax:           161.00 cm/s AV Vmean:          108.000 cm/s AV VTI:            0.334 m AV Peak Grad:      10.4 mmHg AV Mean Grad:  6.0 mmHg LVOT Vmax:         113.00 cm/s LVOT Vmean:        80.700 cm/s LVOT VTI:          0.249 m LVOT/AV VTI ratio: 0.75  AORTA Ao Root diam: 3.20 cm Ao Asc diam:  2.80 cm MITRAL VALVE MV Area (PHT): 2.73 cm    SHUNTS MV Decel Time: 278 msec    Systemic VTI:  0.25 m MV E velocity: 78.60 cm/s  Systemic Diam: 2.00 cm MV A velocity: 84.40 cm/s MV E/A ratio:  0.93 Arvilla Meres MD Electronically signed by Arvilla Meres MD Signature Date/Time: 04/03/2023/2:08:17 PM    Final    CARDIAC CATHETERIZATION Result Date: 04/03/2023   1st Diag lesion is 100% stenosed.   RPDA lesion is 50% stenosed.   1st Mrg lesion is 80% stenosed.   Prox Cx to Mid Cx lesion is 50% stenosed.   Previously placed Dist LM to Prox LAD stent of unknown type is  widely patent.   Previously placed Mid LAD stent of unknown type is  widely patent.   Previously placed Mid Cx to Dist Cx stent of unknown type is  widely patent.   There is mild left ventricular systolic dysfunction.   LV end diastolic pressure is normal.   The left ventricular ejection fraction is 50-55% by visual estimate.   Recommend uninterrupted dual antiplatelet therapy with Aspirin 81mg  daily and Ticagrelor 90mg  twice daily for a minimum of 12 months (ACS-Class I  recommendation). 1.  Patent left main with nonobstructive plaquing, with patent stent extending from the left main into the proximal LAD 2.  Patent LAD stents with total occlusion of the first diagonal stent (small caliber diagonal) 3.  Patent circumflex with a patent mid to distal vessel stent, severe distal OM branch stenosis not suitable for PCI due to small caliber vessel. 4.  Widely patent RCA with mild to moderate PDA stenosis (large dominant vessel) 5.  Mild segmental LV dysfunction with moderate hypokinesis of the distal anterolateral wall, LVEF estimated at 50 to 55% Recommendations: The patient has had ongoing chest discomfort since 11 AM yesterday (approximately 20 hours), now chest pain-free.  I suspect he has sustained an diagonal territory infarct with very limited benefit of late intervention.  This appears to have supplied a small area of myocardium.  Recommend medical therapy.  LVEDP is normal and he appears to have only mild LV dysfunction.   DG Chest 2 View Result Date: 04/02/2023 CLINICAL DATA:  chest pressure EXAM: CHEST - 2 VIEW COMPARISON:  None available. FINDINGS: Cardiomediastinal silhouette and pulmonary vasculature are within normal limits. Lungs are clear. IMPRESSION: No acute cardiopulmonary process. Electronically Signed   By: Acquanetta Belling M.D.   On: 04/02/2023 19:08   Disposition   Pt is being discharged home today in good condition.  Follow-up Plans & Appointments     Follow-up Information     [facility]. Go on 05/16/2023.   Specialty: Internal Medicine Why: @8 :Azzie Roup information: [address] [place] [address] [phone] Additional information: [address] [place], [place] [address]               Discharge Instructions     AMB Referral to [facility]   Complete by: As directed    Reason For Referral: Lipids   Amb Referral to Cardiac  Rehabilitation  Complete by: As directed    Diagnosis: NSTEMI   After initial evaluation and assessments completed: Virtual Based Care may be provided alone or in conjunction with Phase 2 Cardiac Rehab based on patient barriers.: Yes   Intensive Cardiac Rehabilitation (ICR) [facility] location only OR Traditional Cardiac Rehabilitation (TCR) *If criteria for ICR are not met will enroll in TCR [facility] only): Yes   Call MD for:  difficulty breathing, headache or visual disturbances   Complete by: As directed    Call MD for:  persistant dizziness or light-headedness   Complete by: As directed    Call MD for:  redness, tenderness, or signs of infection (pain, swelling, redness, odor or green/yellow discharge around incision site)   Complete by: As directed    Diet - low sodium heart healthy   Complete by: As directed    Discharge instructions   Complete by: As directed    Radial Site Care Refer to this sheet in the next few weeks. These instructions provide you with information on caring for yourself after your procedure. Your caregiver may also give you more specific instructions. Your treatment has been planned according to current medical practices, but problems sometimes occur. Call your caregiver if you have any problems or questions after your procedure. HOME CARE INSTRUCTIONS You may shower the day after the procedure. Remove the bandage (dressing) and gently wash the site with plain soap and water. Gently pat the site dry.  Do not apply powder or lotion to the site.  Do not submerge the affected site in water for 3 to 5 days.  Inspect the site at least twice daily.  Do not flex or bend the affected arm for 24 hours.  No lifting over 5 pounds (2.3 kg) for 5 days after your procedure.  Do not drive home if you are discharged the same day of the procedure. Have someone else drive you.  You may drive 24 hours after the procedure unless otherwise instructed by your caregiver.  What to expect: Any  bruising will usually fade within 1 to 2 weeks.  Blood that collects in the tissue (hematoma) may be painful to the touch. It should usually decrease in size and tenderness within 1 to 2 weeks.  SEEK IMMEDIATE MEDICAL CARE IF: You have unusual pain at the radial site.  You have redness, warmth, swelling, or pain at the radial site.  You have drainage (other than a small amount of blood on the dressing).  You have chills.  You have a fever or persistent symptoms for more than 72 hours.  You have a fever and your symptoms suddenly get worse.  Your arm becomes pale, cool, tingly, or numb.  You have heavy bleeding from the site. Hold pressure on the site.   Increase activity slowly   Complete by: As directed    No wound care   Complete by: As directed         Discharge Medications   Allergies as of 04/04/2023   No Known Allergies      Medication List     STOP taking these medications    Basaglar KwikPen 100 UNIT/ML   famotidine 20 MG tablet Commonly known as: PEPCID   hydrochlorothiazide 12.5 MG capsule Commonly known as: MICROZIDE   simethicone 80 MG chewable tablet Commonly known as: MYLICON       TAKE these medications    aspirin 81 MG chewable tablet Chew 81 mg by mouth daily.   atorvastatin 80 MG tablet  Commonly known as: LIPITOR Take 80 mg by mouth at bedtime.   Farxiga 10 MG Tabs tablet Generic drug: dapagliflozin propanediol Take 1 tablet (10 mg total) by mouth daily.   gabapentin 300 MG capsule Commonly known as: NEURONTIN Take 300 mg by mouth 2 (two) times daily.   isosorbide mononitrate 30 MG 24 hr tablet Commonly known as: IMDUR Take 30 mg by mouth daily.   LORazepam 1 MG tablet Commonly known as: ATIVAN Take 1 mg by mouth every 8 (eight) hours as needed for anxiety.   losartan-hydrochlorothiazide 50-12.5 MG tablet Commonly known as: HYZAAR Take 1 tablet by mouth daily.   metFORMIN 1000 MG tablet Commonly known as: GLUCOPHAGE Take  1,000 mg by mouth 2 (two) times daily with a meal.   metoprolol tartrate 25 MG tablet Commonly known as: LOPRESSOR Take 12.5 mg by mouth 2 (two) times daily.   nitroGLYCERIN 0.4 MG SL tablet Commonly known as: NITROSTAT Place 1 tablet (0.4 mg total) under the tongue every 5 (five) minutes x 3 doses as needed for chest pain.   pantoprazole 40 MG tablet Commonly known as: PROTONIX Take 40 mg by mouth daily.   sennosides-docusate sodium 8.6-50 MG tablet Commonly known as: SENOKOT-S Take 1 tablet by mouth in the morning and at bedtime.   thiamine 100 MG tablet Commonly known as: VITAMIN B1 Take 100 mg by mouth daily.   ticagrelor 90 MG Tabs tablet Commonly known as: BRILINTA Take 90 mg by mouth 2 (two) times daily.        Outstanding Labs/Studies   FLP/LFTs in 8 weeks   Duration of Discharge Encounter: APP Time: 32 minutes   Signed, Laverda Page, NP 04/04/2023, 12:08 PM   Patient seen and examined. Agree with assessment and plan.  Patient underwent cardiac catheterization which revealed previously placed stents in the LAD ostium, proximal to mid LAD, and circumflex vessel to be patent.  The diagonal vessel which was small caliber it was totally occluded and medical therapy was recommended.  Currently patient feels well.  No chest pain or shortness of breath.  There is a residual area of ecchymosis on his left arm from his prior catheterization in [place].  Current right radial artery site is stable.  Will plan to discharge today with follow-up with Dr. Excell Seltzer who performed his cardiac catheterization.   Lennette Bihari, MD, [facility] 04/04/2023 5:08 PM

## 2023-04-04 NOTE — Telephone Encounter (Signed)
 Tried to call patient with interp ID # [id number]  First number [phone] said wireless person we are trying to reach is not available.  Left voicemail at [phone] to return call to office

## 2023-04-04 NOTE — Telephone Encounter (Signed)
   Transition of Care Follow-up Phone Call Request    Patient Name: Darren Garrett Date of Birth: 10-21-1975 Date of Encounter: 04/04/2023  Primary Care Provider:  System, Provider Not In Primary Cardiologist:  Tonny Bollman, MD  Ulyess Blossom has been scheduled for a transition of care follow up appointment with a [facility] provider:  Jari Favre 3/13  Please reach out to [facility] within 48 hours of discharge to confirm appointment and review transition of care protocol questionnaire. Anticipated discharge date: 3/4  Laverda Page, NP  04/04/2023, 11:07 AM

## 2023-04-04 NOTE — Progress Notes (Signed)
 CARDIAC REHAB PHASE I   Pt resting in bed , feeling well today. Pt reports tolerating independent mobility with no CP, SOB or dizziness. Post MI education including restrictions, risk factors, exercise guidelines, antiplatelet therapy importance, MI booklet, NTG use, heart healthy diabetic diet and CRP2 reviewed. All questions and concerns addressed. Will refer to [facility] for CRP2. Plan for possible discharge later today.   [id number] Woodroe Chen, RN BSN 04/04/2023 10:06 AM

## 2023-04-04 NOTE — TOC Transition Note (Signed)
 Transition of Care [facility]) - Discharge Note   Patient Details  Name: Darren Garrett MRN: [id number] Date of Birth: Nov 11, 1975  Transition of [facility]) CM/SW Contact:  Leone Haven, RN Phone Number: 04/04/2023, 12:09 PM   Clinical Narrative:    For dc home today, has transport. Follow up on AVS.          Patient Goals and CMS Choice            Discharge Placement                       Discharge Plan and Services Additional resources added to the After Visit Summary for                                       Social Drivers of Health (SDOH) Interventions SDOH Screenings   Food Insecurity: No Food Insecurity (04/03/2023)  Housing: Low Risk  (04/03/2023)  Transportation Needs: No Transportation Needs (04/03/2023)  Utilities: Not At Risk (04/03/2023)  Social Connections: Moderately Isolated (04/03/2023)  Tobacco Use: Low Risk  (04/03/2023)     Readmission Risk Interventions     No data to display

## 2023-04-05 NOTE — Telephone Encounter (Signed)
 Attempted phone call to pt.  Message states wireless pt is not available.

## 2023-04-09 ENCOUNTER — Emergency Department (HOSPITAL_COMMUNITY)

## 2023-04-09 ENCOUNTER — Emergency Department (HOSPITAL_COMMUNITY)
Admission: EM | Admit: 2023-04-09 | Discharge: 2023-04-10 | Discharge status: Against Medical Advice | Attending: Emergency Medicine | Admitting: Emergency Medicine

## 2023-04-09 ENCOUNTER — Encounter (HOSPITAL_COMMUNITY): Payer: Self-pay

## 2023-04-09 DIAGNOSIS — R0789 Other chest pain: Secondary | ICD-10-CM | POA: Diagnosis present

## 2023-04-09 DIAGNOSIS — Z5321 Procedure and treatment not carried out due to patient leaving prior to being seen by health care provider: Secondary | ICD-10-CM | POA: Diagnosis not present

## 2023-04-09 LAB — CBC WITH DIFFERENTIAL/PLATELET
Abs Immature Granulocytes: 0.06 10*3/uL (ref 0.00–0.07)
Basophils Absolute: 0 10*3/uL (ref 0.0–0.1)
Basophils Relative: 0 %
Eosinophils Absolute: 0.1 10*3/uL (ref 0.0–0.5)
Eosinophils Relative: 1 %
HCT: 47.2 % (ref 39.0–52.0)
Hemoglobin: 15.6 g/dL (ref 13.0–17.0)
Immature Granulocytes: 1 %
Lymphocytes Relative: 16 %
Lymphs Abs: 1.9 10*3/uL (ref 0.7–4.0)
MCH: 25.4 pg — ABNORMAL LOW (ref 26.0–34.0)
MCHC: 33.1 g/dL (ref 30.0–36.0)
MCV: 76.9 fL — ABNORMAL LOW (ref 80.0–100.0)
Monocytes Absolute: 0.8 10*3/uL (ref 0.1–1.0)
Monocytes Relative: 7 %
Neutro Abs: 9.3 10*3/uL — ABNORMAL HIGH (ref 1.7–7.7)
Neutrophils Relative %: 75 %
Platelets: 360 10*3/uL (ref 150–400)
RBC: 6.14 MIL/uL — ABNORMAL HIGH (ref 4.22–5.81)
RDW: 13.6 % (ref 11.5–15.5)
WBC: 12.2 10*3/uL — ABNORMAL HIGH (ref 4.0–10.5)
nRBC: 0 % (ref 0.0–0.2)

## 2023-04-09 LAB — COMPREHENSIVE METABOLIC PANEL
ALT: 33 U/L (ref 0–44)
AST: 30 U/L (ref 15–41)
Albumin: 3.8 g/dL (ref 3.5–5.0)
Alkaline Phosphatase: 77 U/L (ref 38–126)
Anion gap: 14 (ref 5–15)
BUN: 44 mg/dL — ABNORMAL HIGH (ref 6–20)
CO2: 17 mmol/L — ABNORMAL LOW (ref 22–32)
Calcium: 9.2 mg/dL (ref 8.9–10.3)
Chloride: 102 mmol/L (ref 98–111)
Creatinine, Ser: 1.9 mg/dL — ABNORMAL HIGH (ref 0.61–1.24)
GFR, Estimated: 43 mL/min — ABNORMAL LOW (ref >60)
Glucose, Bld: 138 mg/dL — ABNORMAL HIGH (ref 70–99)
Potassium: 4.5 mmol/L (ref 3.5–5.1)
Sodium: 133 mmol/L — ABNORMAL LOW (ref 135–145)
Total Bilirubin: 1 mg/dL (ref 0.0–1.2)
Total Protein: 7.4 g/dL (ref 6.5–8.1)

## 2023-04-09 LAB — RESP PANEL BY RT-PCR (RSV, FLU A&B, COVID)  RVPGX2
Influenza A by PCR: NEGATIVE
Influenza B by PCR: NEGATIVE
Resp Syncytial Virus by PCR: NEGATIVE
SARS Coronavirus 2 by RT PCR: NEGATIVE

## 2023-04-09 LAB — TROPONIN I (HIGH SENSITIVITY)
Troponin I (High Sensitivity): 308 ng/L (ref <18)
Troponin I (High Sensitivity): 281 ng/L (ref <18)

## 2023-04-09 LAB — LIPASE, BLOOD: Lipase: 46 U/L (ref 11–51)

## 2023-04-09 MED ORDER — PANTOPRAZOLE SODIUM 40 MG PO TBEC
40.0000 mg | DELAYED_RELEASE_TABLET | Freq: Once | ORAL | Status: AC
Start: 2023-04-09 — End: 2023-04-09
  Administered 2023-04-09: 40 mg via ORAL
  Filled 2023-04-09: qty 1

## 2023-04-09 NOTE — ED Notes (Signed)
 Pt states that this pain has nothing to do with his heart and that he is smelling smoke which is coming from his chest and making his head hurt and his eyes foggy.

## 2023-04-09 NOTE — ED Provider Triage Note (Signed)
 Emergency Medicine Provider Triage Evaluation Note  Darren Garrett , a 48 y.o. male  was evaluated in triage.  Pt complains of chest burning sensation.  Patient reports chest burning sensation that radiates to his nose.  States that it makes his eyes feel blurry as well.  Reports history of this in the past for which his primary care doctor gave him a medication with resolution of symptoms.  States the symptoms began about 4 to 5 days after being discharged from the hospital, when he had an NSTEMI.  Review of Systems  Positive: Chest pain Negative: Shortness of breath  Physical Exam  BP 113/71 (BP Location: Right Arm)   Pulse 88   Temp 98.1 F (36.7 C) (Oral)   Resp 18   SpO2 100%  Gen:   Awake, no distress   Resp:  Normal effort  MSK:   Moves extremities without difficulty  Other:  No focal neurologic deficits  Medical Decision Making  Medically screening exam initiated at 6:18 PM.  Appropriate orders placed.  Darren Garrett was informed that the remainder of the evaluation will be completed by another provider, this initial triage assessment does not replace that evaluation, and the importance of remaining in the ED until their evaluation is complete.   Maxwell Marion, PA-C 04/09/23 1821

## 2023-04-09 NOTE — ED Triage Notes (Addendum)
 Pt c/o central chest "burning" and intermittently smelling smoke x several days.  Pain score 6/10.  Pt reports this has happened before, he was given medicine, and it resolved. Pt had a heart cath on 3/3.  Pt reports he is concerned he has cancer.

## 2023-04-10 NOTE — ED Notes (Signed)
 Pt called for room with no answer.

## 2023-04-13 ENCOUNTER — Encounter: Payer: Self-pay | Admitting: Physician Assistant

## 2023-04-13 ENCOUNTER — Ambulatory Visit: Payer: Self-pay | Attending: Physician Assistant | Admitting: Physician Assistant

## 2023-04-13 VITALS — BP 118/64 | HR 79 | Ht 66.0 in | Wt 169.2 lb

## 2023-04-13 DIAGNOSIS — E113599 Type 2 diabetes mellitus with proliferative diabetic retinopathy without macular edema, unspecified eye: Secondary | ICD-10-CM | POA: Diagnosis not present

## 2023-04-13 DIAGNOSIS — Z794 Long term (current) use of insulin: Secondary | ICD-10-CM

## 2023-04-13 DIAGNOSIS — E785 Hyperlipidemia, unspecified: Secondary | ICD-10-CM | POA: Diagnosis not present

## 2023-04-13 DIAGNOSIS — Z79899 Other long term (current) drug therapy: Secondary | ICD-10-CM

## 2023-04-13 DIAGNOSIS — I10 Essential (primary) hypertension: Secondary | ICD-10-CM

## 2023-04-13 MED ORDER — ISOSORBIDE MONONITRATE ER 60 MG PO TB24: 60.0000 mg | ORAL_TABLET | Freq: Every day | ORAL | 3 refills | Status: DC

## 2023-04-13 MED ORDER — NITROGLYCERIN 0.4 MG SL SUBL: 0.4000 mg | SUBLINGUAL_TABLET | SUBLINGUAL | 2 refills | Status: DC | PRN

## 2023-04-13 NOTE — Patient Instructions (Signed)
 Medication Instructions:  Increase Imdur to 60 mg once daily *If you need a refill on your cardiac medications before your next appointment, please call your pharmacy*   Lab Work: Fasting lipid panel, LFT in 6-8 weeks If you have labs (blood work) drawn today and your tests are completely normal, you will receive your results only by: MyChart Message (if you have MyChart) OR A paper copy in the mail If you have any lab test that is abnormal or we need to change your treatment, we will call you to review the results.   Testing/Procedures: NONE   Follow-Up: At [facility], you and your health needs are our priority.  As part of our continuing mission to provide you with exceptional heart care, we have created designated Provider Care Teams.  These Care Teams include your primary Cardiologist (physician) and Advanced Practice Providers (APPs -  Physician Assistants and Nurse Practitioners) who all work together to provide you with the care you need, when you need it.  We recommend signing up for the patient portal called "MyChart".  Sign up information is provided on this After Visit Summary.  MyChart is used to connect with patients for Virtual Visits (Telemedicine).  Patients are able to view lab/test results, encounter notes, upcoming appointments, etc.  Non-urgent messages can be sent to your provider as well.   To learn more about what you can do with MyChart, go to [email or web address].    Your next appointment:   3 month(s)   Provider:   Excell Seltzer, MD  Other Instructions Check blood pressure 1 hour after medications for 2 weeks and send [place] the readings on MyChart

## 2023-04-13 NOTE — Progress Notes (Signed)
 Cardiology Office Note:  .   Date:  04/13/2023  ID:  Darren Garrett, DOB 25-Apr-1975, MRN [id number] PCP: Aviva Kluver  [facility] Providers Cardiologist:  Tonny Bollman, MD {  History of Present Illness: .   Darren Garrett is a 49 y.o. male with a significant past medical history of coronary artery disease status post drug-eluting stents reportedly 2 weeks ago in [facility] (documentation of anatomy unknown), HTN, insulin-dependent diabetes, gastroesophageal reflux disease who was seen 04/19/2023 for evaluation of chest pain concerning for NSTEMI/ACS.  Patient reports around 39 AM prior to ER visit he was driving to work and suddenly noticed chest pain that initially went throughout his chest.  Pain described as squeezing periodically, pressure-like and symptoms burning.  Initial pain was 10 out of 10 intensity.  Decided not to go to work that day.  Took medications for his CAD eventually chest pain progressed and he drove himself to [facility] emergency department.  He denies sweating, nausea, dizziness.  4 out of 10 with improvement of send medications at the other emergency department.  He was admitted in the hospital in [place] around 2 weeks prior due to chest pain.  States he has 2 stents in his heart. He presents today for follow-up from his ER visit and cardiac cath.  The patient, with a history of coronary artery disease and recent stent placement, presents for follow-up after a recent ER visit for chest pain. The pain, initially rated as ten out of ten, decreased to four out of ten after taking his prescribed medications. A cardiac catheterization revealed patent stents and a severe stenosis in a branch of the LAD. The patient has been managing his intermittent chest pain with nitroglycerin as needed. He reported needing to use nitroglycerin once in the past few days. The patient also reported a sore spot on his arm where a cardiac catheterization was performed in [place]. The patient's  LDL cholesterol is high at ninety four, despite being on Lipitor. His diet is high in red meat, which he consumes a few days a week.  Reports no shortness of breath nor dyspnea on exertion.  No edema, orthopnea, PND. Reports no palpitations.   Discussed the use of AI scribe software for clinical note transcription with the patient, who gave verbal consent to proceed.  ROS: pertinent ROS in HPI  Studies Reviewed: .        Cath: 04/19/23     1st Diag lesion is 100% stenosed.   RPDA lesion is 50% stenosed.   1st Mrg lesion is 80% stenosed.   Prox Cx to Mid Cx lesion is 50% stenosed.   Previously placed Dist LM to Prox LAD stent of unknown type is  widely patent.   Previously placed Mid LAD stent of unknown type is  widely patent.   Previously placed Mid Cx to Dist Cx stent of unknown type is  widely patent.   There is mild left ventricular systolic dysfunction.   LV end diastolic pressure is normal.   The left ventricular ejection fraction is 50-55% by visual estimate.   Recommend uninterrupted dual antiplatelet therapy with Aspirin 81mg  daily and Ticagrelor 90mg  twice daily for a minimum of 12 months (ACS-Class I recommendation).   1.  Patent left main with nonobstructive plaquing, with patent stent extending from the left main into the proximal LAD 2.  Patent LAD stents with total occlusion of the first diagonal stent (small caliber diagonal) 3.  Patent circumflex with a patent mid to  distal vessel stent, severe distal OM branch stenosis not suitable for PCI due to small caliber vessel. 4.  Widely patent RCA with mild to moderate PDA stenosis (large dominant vessel) 5.  Mild segmental LV dysfunction with moderate hypokinesis of the distal anterolateral wall, LVEF estimated at 50 to 55%   Recommendations: The patient has had ongoing chest discomfort since 11 AM yesterday (approximately 20 hours), now chest pain-free.  I suspect he has sustained an diagonal territory infarct with very  limited benefit of late intervention.  This appears to have supplied a small area of myocardium.  Recommend medical therapy.  LVEDP is normal and he appears to have only mild LV dysfunction.   Diagnostic Dominance: Right    Echo: 04/03/2023   IMPRESSIONS     1. Left ventricular ejection fraction, by estimation, is 60 to 65%. The  left ventricle has normal function. The left ventricle has no regional  wall motion abnormalities. There is mild concentric left ventricular  hypertrophy. Left ventricular diastolic  parameters are consistent with Grade I diastolic dysfunction (impaired  relaxation).   2. Right ventricular systolic function is normal. The right ventricular  size is normal.   3. The mitral valve is normal in structure. No evidence of mitral valve  regurgitation. No evidence of mitral stenosis.   4. The aortic valve is tricuspid. There is mild calcification of the  aortic valve. Aortic valve regurgitation is trivial. Aortic valve  sclerosis/calcification is present, without any evidence of aortic  stenosis.   5. The inferior vena cava is normal in size with greater than 50%  respiratory variability, suggesting right atrial pressure of 3 mmHg.   FINDINGS   Left Ventricle: Left ventricular ejection fraction, by estimation, is 60  to 65%. The left ventricle has normal function. The left ventricle has no  regional wall motion abnormalities. Definity contrast agent was given IV  to delineate the left ventricular   endocardial borders. The left ventricular internal cavity size was normal  in size. There is mild concentric left ventricular hypertrophy. Left  ventricular diastolic parameters are consistent with Grade I diastolic  dysfunction (impaired relaxation).   Right Ventricle: The right ventricular size is normal. No increase in  right ventricular wall thickness. Right ventricular systolic function is  normal.   Left Atrium: Left atrial size was normal in size.   Right  Atrium: Right atrial size was normal in size.   Pericardium: There is no evidence of pericardial effusion.   Mitral Valve: The mitral valve is normal in structure. No evidence of  mitral valve regurgitation. No evidence of mitral valve stenosis.   Tricuspid Valve: The tricuspid valve is normal in structure. Tricuspid  valve regurgitation is not demonstrated. No evidence of tricuspid  stenosis.   Aortic Valve: The aortic valve is tricuspid. There is mild calcification  of the aortic valve. Aortic valve regurgitation is trivial. Aortic valve  sclerosis/calcification is present, without any evidence of aortic  stenosis. Aortic valve mean gradient  measures 6.0 mmHg. Aortic valve peak gradient measures 10.4 mmHg. Aortic  valve area, by VTI measures 2.34 cm.   Pulmonic Valve: The pulmonic valve was normal in structure. Pulmonic valve  regurgitation is not visualized. No evidence of pulmonic stenosis.   Aorta: The aortic root is normal in size and structure.   Venous: The inferior vena cava is normal in size with greater than 50%  respiratory variability, suggesting right atrial pressure of 3 mmHg.   IAS/Shunts: No atrial level shunt detected  by color flow Doppler.    Physical Exam:   VS:  BP 118/64   Pulse 79   Ht 5\' 6"  (1.676 m)   Wt 169 lb 3.2 oz (76.7 kg)   SpO2 98%   BMI 27.31 kg/m    Wt Readings from Last 3 Encounters:  04/13/23 169 lb 3.2 oz (76.7 kg)  04/09/23 165 lb (74.8 kg)  04/04/23 165 lb 4.8 oz (75 kg)    GEN: Well nourished, well developed in no acute distress NECK: No JVD; No carotid bruits CARDIAC: RRR, no murmurs, rubs, gallops RESPIRATORY:  Clear to auscultation without rales, wheezing or rhonchi  ABDOMEN: Soft, non-tender, non-distended EXTREMITIES:  No edema; No deformity   ASSESSMENT AND PLAN: .    Coronary Artery Disease with Stent Placement Recent catheterization showed patent stents but severe stenosis in an LAD branch causing infarct. Medical  therapy recommended for angina management and event prevention. - Increase Imdur to 60 mg daily. - Monitor blood pressure post-Imdur for hypotension. - Refill nitroglycerin and advise carrying for acute angina.  Hyperlipidemia LDL at 94 mg/dL, above target <41 mg/dL. On atorvastatin 80 mg. Dietary changes recommended. Consider ezetimibe if LDL remains high. - Provide dietary information for lipid-lowering, reduce red meat to once weekly. - Recheck lipid panel and liver function in 6-8 weeks. - Consider ezetimibe 10 mg daily if LDL remains elevated.  Post-Cardiac Catheterization Arm Pain Intermittent left arm pain likely due to nerve irritation, resolving. - Advise reporting new symptoms like swelling or numbness.  Follow-up Follow-up necessary to monitor treatment response and adjust management. - Schedule follow-up with Dr. Excell Seltzer in 3 months. - Ensure [facility] receives updated prescriptions for Imdur and nitroglycerin.    Cardiac Rehabilitation Eligibility Assessment  The patient is ready to start cardiac rehabilitation from a cardiac standpoint.      Dispo: He can follow-up in 3 months with Dr. Excell Seltzer  Signed, Sharlene Dory, PA-C

## 2023-04-14 ENCOUNTER — Ambulatory Visit: Payer: Self-pay | Attending: Cardiology

## 2023-04-14 NOTE — Progress Notes (Deleted)
 Office Visit    Patient Name: Darren Garrett Date of Encounter: 04/14/2023  Primary Care Provider:  Oneita Hurt, No Primary Cardiologist:  Tonny Bollman, MD  Chief Complaint    Hyperlipidemia   Significant Past Medical History   CAD DES in [place], 3/25 NSTEMI  HTN Losartan/hctz  DM 3/25 A1c 7.8 - on metformin, dapagliflozin  GERD pantoprazole        No Known Allergies  History of Present Illness    Darren Garrett is a 48 y.o. male patient of Dr Excell Seltzer, in the office today to discuss options for cholesterol management.  Insurance Carrier:   Pharmacy:     [facility]:      LDL Cholesterol goal:    Current Medications:     Previously tried:    Family Hx:     Social Hx: Tobacco: Alcohol:      Diet:      Exercise:   Adherence Assessment  Do you ever forget to take your medication? [] Yes [] No  Do you ever skip doses due to side effects? [] Yes [] No  Do you have trouble affording your medicines? [] Yes [] No  Are you ever unable to pick up your medication due to transportation difficulties? [] Yes [] No  Do you ever stop taking your medications because you don't believe they are helping? [] Yes [] No  Do you check your weight daily? [] Yes [] No   Adherence strategy: ***  Barriers to obtaining medications: ***     Accessory Clinical Findings   Lab Results  Component Value Date   CHOL 167 04/03/2023   HDL 40 (L) 04/03/2023   LDLCALC 94 04/03/2023   TRIG 167 (H) 04/03/2023   CHOLHDL 4.2 04/03/2023    Lipoprotein (a)  Date/Time Value Ref Range Status  04/03/2023 05:26 AM 104.0 (H) <75.0 nmol/L Final    Comment:    (NOTE) Note:  Values greater than or equal to 75.0 nmol/L may       indicate an independent risk factor for CHD,       but must be evaluated with caution when applied       to non-Caucasian populations due to the       influence of genetic factors on Lp(a) across       ethnicities. Performed At: [facility] [address]  [place], [place] [id number] Jolene Schimke MD YQ:[id number]     Lab Results  Component Value Date   ALT 33 04/09/2023   AST 30 04/09/2023   ALKPHOS 77 04/09/2023   BILITOT 1.0 04/09/2023   Lab Results  Component Value Date   CREATININE 1.90 (H) 04/09/2023   BUN 44 (H) 04/09/2023   NA 133 (L) 04/09/2023   K 4.5 04/09/2023   CL 102 04/09/2023   CO2 17 (L) 04/09/2023   Lab Results  Component Value Date   HGBA1C 7.8 (H) 04/03/2023    Home Medications    Current Outpatient Medications  Medication Sig Dispense Refill   acetaminophen (TYLENOL) 325 MG tablet Take 650 mg by mouth every 6 (six) hours as needed.     aspirin 81 MG chewable tablet Chew 81 mg by mouth daily.     atorvastatin (LIPITOR) 80 MG tablet Take 80 mg by mouth at bedtime.     dapagliflozin propanediol (FARXIGA) 10 MG TABS tablet Take 1 tablet (10 mg total) by mouth daily. 90 tablet 0   gabapentin (NEURONTIN) 300 MG capsule Take 300 mg by mouth 2 (two) times daily.     ibuprofen (ADVIL) 200  MG tablet Take 200 mg by mouth every 6 (six) hours as needed.     isosorbide mononitrate (IMDUR) 60 MG 24 hr tablet Take 1 tablet (60 mg total) by mouth daily. 90 tablet 3   LORazepam (ATIVAN) 1 MG tablet Take 1 mg by mouth every 8 (eight) hours as needed for anxiety.     losartan-hydrochlorothiazide (HYZAAR) 50-12.5 MG tablet Take 1 tablet by mouth daily.     metFORMIN (GLUCOPHAGE) 1000 MG tablet Take 1,000 mg by mouth 2 (two) times daily with a meal.     metoprolol tartrate (LOPRESSOR) 25 MG tablet Take 12.5 mg by mouth 2 (two) times daily.     nitroGLYCERIN (NITROSTAT) 0.4 MG SL tablet Place 1 tablet (0.4 mg total) under the tongue every 5 (five) minutes x 3 doses as needed for chest pain. 25 tablet 2   pantoprazole (PROTONIX) 40 MG tablet Take 40 mg by mouth daily.     sennosides-docusate sodium (SENOKOT-S) 8.6-50 MG tablet Take 1 tablet by mouth in the morning and at bedtime.     thiamine (VITAMIN B1) 100 MG tablet Take 100  mg by mouth daily.     ticagrelor (BRILINTA) 90 MG TABS tablet Take 90 mg by mouth 2 (two) times daily.     No current facility-administered medications for this visit.     Assessment & Plan    No problem-specific Assessment & Plan notes found for this encounter.   Phillips Hay, PharmD CPP [facility] [address]  [place], [place] [address] [phone]  04/14/2023, 7:02 AM

## 2023-04-19 ENCOUNTER — Telehealth (HOSPITAL_COMMUNITY): Payer: Self-pay

## 2023-04-19 NOTE — Telephone Encounter (Signed)
 Called pt via interpreter, pt stated he is not interested. Will close referral.

## 2023-05-16 ENCOUNTER — Ambulatory Visit: Payer: Self-pay | Admitting: Nurse Practitioner

## 2023-06-15 ENCOUNTER — Other Ambulatory Visit: Payer: Self-pay | Admitting: Physician Assistant

## 2023-06-19 ENCOUNTER — Encounter: Payer: Self-pay | Admitting: Pharmacist Clinician (PhC)/ Clinical Pharmacy Specialist

## 2023-06-19 ENCOUNTER — Ambulatory Visit: Attending: Cardiology | Admitting: Pharmacist Clinician (PhC)/ Clinical Pharmacy Specialist

## 2023-06-19 DIAGNOSIS — E785 Hyperlipidemia, unspecified: Secondary | ICD-10-CM | POA: Diagnosis not present

## 2023-06-19 MED ORDER — ATORVASTATIN CALCIUM 80 MG PO TABS: 80.0000 mg | ORAL_TABLET | Freq: Every day | ORAL | 3 refills | Status: DC

## 2023-06-19 MED ORDER — TICAGRELOR 90 MG PO TABS: 90.0000 mg | ORAL_TABLET | Freq: Two times a day (BID) | ORAL | 3 refills | Status: DC

## 2023-06-19 MED ORDER — LOSARTAN POTASSIUM-HCTZ 50-12.5 MG PO TABS: 1.0000 | ORAL_TABLET | Freq: Every day | ORAL | 3 refills | Status: DC

## 2023-06-19 MED ORDER — METOPROLOL TARTRATE 25 MG PO TABS: 12.5000 mg | ORAL_TABLET | Freq: Two times a day (BID) | ORAL | 3 refills | Status: DC

## 2023-06-19 MED ORDER — ASPIRIN 81 MG PO TBEC: 81.0000 mg | DELAYED_RELEASE_TABLET | Freq: Every day | ORAL | 3 refills | Status: DC

## 2023-06-19 MED ORDER — DAPAGLIFLOZIN PROPANEDIOL 10 MG PO TABS
10.0000 mg | ORAL_TABLET | Freq: Every day | ORAL | 3 refills | Status: DC
Start: 2023-06-19 — End: 2023-08-29

## 2023-06-19 MED ORDER — ISOSORBIDE MONONITRATE ER 60 MG PO TB24: 90.0000 mg | ORAL_TABLET | Freq: Every day | ORAL | 3 refills | Status: DC

## 2023-06-19 NOTE — Assessment & Plan Note (Signed)
 Patient with elevated LDL, non compliant with atorvastatin .    Reviewed all meds on his list one by one.  Explained which medications were okay to take as needed and which were vital to be taken daily.  Explained that we do not want to continue with heart catheterization every few months, but rather want the medications to do their job and help prevent future complications.  Divided meds out to balance between morning and night.  AM:  dapagliflozin  10 mg, isosorbide  mono 90 mg, losartan /hctz 50/12.5 mg, metformin 1,000 mg, metoprolol  tart 25 mg,  ticagrelor  90 mg  PM:  aspirin  81 mg, atorvastatin  80 mg, metformin 1,000 mg, metoprolol  tart 25 mg, ticagrelor  90 mg.    Because he was taking between 60 and 120 mg of isosorbide  daily (and skipping some days as well), will increase dose to 90 mg once daily.  Reviewed how to take SL NTG as well, and that he should not put extra doses of isosorbide  in the glass bottle with the SL NTG.    Will also refill all other cardiac medications, as he doesn't have any fill history except for the dapagliflozin  (30 day supply last filled in March)  I will reach out to him in the next 2 weeks to see if he was able to pick up medications, and if not, is there a financial concern that we can refer to social work.     Will repeat cholesterol labs in 2 months after regular daily use of atorvastatin .

## 2023-06-19 NOTE — Patient Instructions (Addendum)
 Your Results:             Your most recent labs Goal  Total Cholesterol 167 < 200  Triglycerides 167 < 150  HDL (happy/good cholesterol) 40 > 40  LDL (lousy/bad cholesterol 94 < 55   TAKE YOUR MEDICATIONS AS WE REVIEWED THEM:  AM:  Farxiga , losartan /hctz, metformin, metoprolol , Brilinta , isosorbide  (1.5 tablets)  PM:  metformin, metoprolol , Brilinta , aspirin , atorvastatin   I will reach out to you in a few weeks to see how you are doing.  We will repeat blood work in 2 months to see if the cholesterol medication (atorvastatin ) is working like it should.  If you have chest pains that don't go away or seem more severe, please go to the Emergency Room  Thank you for choosing [facility]

## 2023-06-19 NOTE — Progress Notes (Signed)
 Office Visit    Patient Name: Darren Garrett Date of Encounter: 06/19/2023  Primary Care Provider:  Pcp, No Primary Cardiologist:  Arnoldo Lapping, MD  Chief Complaint    Hyperlipidemia   Significant Past Medical History   HTN Controlled on metoprolol , losartan , hctz  DM2 A1c 7.8 on metformin 1 gm bid, added dapagliflozin , d/c basaglar     No Known Allergies  History of Present Illness    [name] is a 48 y.o. male patient of Dr Arlester Ladd, in the office today to discuss options for cholesterol management.  Patient was hospitalized in February, in [place] ) for MI.  Heart cath was done and 2 stents placed, per patient.  He then presented to [facility] in March with chest pain, concerning for NSTEMI.  Repeat cath showed one prox-mid Cx now 50% re-stenosed.   He is in the office today with an interpreter, although his English is good.  She only had to jump in once to clarify things.  In reviewing his medication, he tells me that he is still having regular chest pain every few days, but it is relieved if he takes 2 of the isosorbide  mononitrate at a time.  Unsure if he is taking it on days without chest pain, as he is not taking most of his medications except here and there.  He notes that he will sometimes use the NTG SL as well, but he just takes 3 at a time, rather than one every 5 minutes.  As for his other medications, he does not take anything on a daily basis.   Insurance Carrier:  Visual merchandiser:   [facility]   LDL Cholesterol goal:  LDL < 55  Current Medications:   atorvastatin  80 mg every day - taking 1-2 days per week   Accessory Clinical Findings   Lab Results  Component Value Date   CHOL 167 04/03/2023   HDL 40 (L) 04/03/2023   LDLCALC 94 04/03/2023   TRIG 167 (H) 04/03/2023   CHOLHDL 4.2 04/03/2023    Lipoprotein (a)  Date/Time Value Ref Range Status  04/03/2023 05:26 AM 104.0 (H) <75.0 nmol/L Final    Comment:    (NOTE) Note:  Values greater than  or equal to 75.0 nmol/L may       indicate an independent risk factor for CHD,       but must be evaluated with caution when applied       to non-Caucasian populations due to the       influence of genetic factors on Lp(a) across       ethnicities. Performed At: [facility] [address] [place], [place] [id number] Pearlean Botts MD WU:[id number]     Lab Results  Component Value Date   ALT 33 04/09/2023   AST 30 04/09/2023   ALKPHOS 77 04/09/2023   BILITOT 1.0 04/09/2023   Lab Results  Component Value Date   CREATININE 1.90 (H) 04/09/2023   BUN 44 (H) 04/09/2023   NA 133 (L) 04/09/2023   K 4.5 04/09/2023   CL 102 04/09/2023   CO2 17 (L) 04/09/2023   Lab Results  Component Value Date   HGBA1C 7.8 (H) 04/03/2023    Home Medications    Current Outpatient Medications  Medication Sig Dispense Refill   aspirin  EC 81 MG tablet Take 1 tablet (81 mg total) by mouth daily. Swallow whole. 90 tablet 3   isosorbide  mononitrate (IMDUR ) 60 MG 24 hr tablet Take 1.5 tablets (90 mg  total) by mouth daily. 135 tablet 3   acetaminophen (TYLENOL) 325 MG tablet Take 650 mg by mouth every 6 (six) hours as needed.     atorvastatin  (LIPITOR) 80 MG tablet Take 1 tablet (80 mg total) by mouth daily. 90 tablet 3   dapagliflozin  propanediol (FARXIGA ) 10 MG TABS tablet Take 1 tablet (10 mg total) by mouth daily. 90 tablet 3   gabapentin  (NEURONTIN ) 300 MG capsule Take 300 mg by mouth 2 (two) times daily.     ibuprofen (ADVIL) 200 MG tablet Take 200 mg by mouth every 6 (six) hours as needed.     LORazepam (ATIVAN) 1 MG tablet Take 1 mg by mouth every 8 (eight) hours as needed for anxiety.     losartan -hydrochlorothiazide (HYZAAR) 50-12.5 MG tablet Take 1 tablet by mouth daily. 90 tablet 3   metFORMIN (GLUCOPHAGE) 1000 MG tablet Take 1,000 mg by mouth 2 (two) times daily with a meal.     metoprolol  tartrate (LOPRESSOR ) 25 MG tablet Take 0.5 tablets (12.5 mg total) by mouth 2 (two) times daily.  90 tablet 3   nitroGLYCERIN  (NITROSTAT ) 0.4 MG SL tablet PLACE 1 TABLET UNDER THE TONGUE EVERY 5 (FIVE) MINUTES X 3 DOSES AS NEEDED FOR CHEST PAIN. 75 tablet 3   pantoprazole  (PROTONIX ) 40 MG tablet Take 40 mg by mouth daily.     sennosides-docusate sodium (SENOKOT-S) 8.6-50 MG tablet Take 1 tablet by mouth in the morning and at bedtime.     thiamine  (VITAMIN B1) 100 MG tablet Take 100 mg by mouth daily.     ticagrelor  (BRILINTA ) 90 MG TABS tablet Take 1 tablet (90 mg total) by mouth 2 (two) times daily. 180 tablet 3   No current facility-administered medications for this visit.     Assessment & Plan    Hyperlipidemia Patient with elevated LDL, non compliant with atorvastatin .    Reviewed all meds on his list one by one.  Explained which medications were okay to take as needed and which were vital to be taken daily.  Explained that we do not want to continue with heart catheterization every few months, but rather want the medications to do their job and help prevent future complications.  Divided meds out to balance between morning and night.  AM:  dapagliflozin  10 mg, isosorbide  mono 90 mg, losartan /hctz 50/12.5 mg, metformin 1,000 mg, metoprolol  tart 25 mg,  ticagrelor  90 mg  PM:  aspirin  81 mg, atorvastatin  80 mg, metformin 1,000 mg, metoprolol  tart 25 mg, ticagrelor  90 mg.    Because he was taking between 60 and 120 mg of isosorbide  daily (and skipping some days as well), will increase dose to 90 mg once daily.  Reviewed how to take SL NTG as well, and that he should not put extra doses of isosorbide  in the glass bottle with the SL NTG.    Will also refill all other cardiac medications, as he doesn't have any fill history except for the dapagliflozin  (30 day supply last filled in March)  I will reach out to him in the next 2 weeks to see if he was able to pick up medications, and if not, is there a financial concern that we can refer to social work.     Will repeat cholesterol labs in  2 months after regular daily use of atorvastatin .     Donivan Furry, PharmD CPP [facility] [address]  [place], [place] [address] [phone]  06/19/2023, 10:58 AM

## 2023-08-24 ENCOUNTER — Telehealth: Payer: Self-pay | Admitting: Cardiovascular Disease

## 2023-08-24 NOTE — Telephone Encounter (Signed)
 Pt walked into office reports is having chest pain.  Rates pain 2:10.  Reports yesterday pain was a 10:10 took NTG and isosorbide  mononitrate 60 mg X4 pills and pain subsided.   Reviewed cardiac meds pt reports the only medication he takes is Imdur  and NTG.  Pt pulled NTG bottle from pocket reports keeps NTG and Imdur  in bottle together takes when needed.  Pt has stopped taking ASA 81 mg, atorvastatin  80 mg, farxiga  10 mg, Hyzaar 50-12.5 mg, metoprolol  tartrate 12.5 mg BID and Brilinta  90 mg.    Pt has history of stent was recommended after 04/03/23 Cath:Recommend uninterrupted dual antiplatelet therapy with Aspirin  81mg  daily and Ticagrelor  90mg  twice daily for a minimum of 12 months (ACS-Class I recommendation).   Advised pt of need for ED visit to assess CP.  Advised pt of need to continue medication to keep stents from closing up.  Pt refuses says he will not go to the hospital.  Again advised pt of need for ED visit. Pt again refused.  OV scheduled for Tuesday 08/29/23 with [facility] provider.   Advised pt of location and need for ED evaluation.  Pt thanked me and walked to elevators.

## 2023-08-24 NOTE — Telephone Encounter (Signed)
 He had extensive medication review with Kristin, [facility] at May visit. Below is his recommended regimen.  AM:  dapagliflozin  10 mg, isosorbide  mono 90 mg, losartan /hctz 50/12.5 mg, metformin 1,000 mg, metoprolol  tart 25 mg,  ticagrelor  90 mg   PM:  aspirin  81 mg, atorvastatin  80 mg, metformin 1,000 mg, metoprolol  tart 25 mg, ticagrelor  90 mg.    Appears nonadherence an issue. Concern that PRN dosing of Imdur  could lead to hypotension. Agree he needs to be evaluated in ER. If he refuses, uncertain how we can help. Okay to leave appointment as scheduled.  Eunie Lawn S Marlee Armenteros, NP

## 2023-08-24 NOTE — Telephone Encounter (Signed)
 Cronkright,Dheeraj here is and would like to talk to a nurse about his heart is hurting and needs a stronger med

## 2023-08-29 ENCOUNTER — Ambulatory Visit (HOSPITAL_BASED_OUTPATIENT_CLINIC_OR_DEPARTMENT_OTHER): Admitting: Family

## 2023-08-29 ENCOUNTER — Encounter (HOSPITAL_BASED_OUTPATIENT_CLINIC_OR_DEPARTMENT_OTHER): Payer: Self-pay | Admitting: Family

## 2023-08-29 VITALS — BP 158/100 | HR 87 | Ht 66.0 in | Wt 175.7 lb

## 2023-08-29 DIAGNOSIS — I10 Essential (primary) hypertension: Secondary | ICD-10-CM

## 2023-08-29 DIAGNOSIS — K219 Gastro-esophageal reflux disease without esophagitis: Secondary | ICD-10-CM | POA: Diagnosis not present

## 2023-08-29 DIAGNOSIS — I25118 Atherosclerotic heart disease of native coronary artery with other forms of angina pectoris: Secondary | ICD-10-CM | POA: Diagnosis not present

## 2023-08-29 DIAGNOSIS — E785 Hyperlipidemia, unspecified: Secondary | ICD-10-CM | POA: Diagnosis not present

## 2023-08-29 DIAGNOSIS — E118 Type 2 diabetes mellitus with unspecified complications: Secondary | ICD-10-CM

## 2023-08-29 DIAGNOSIS — Z794 Long term (current) use of insulin: Secondary | ICD-10-CM

## 2023-08-29 MED ORDER — TICAGRELOR 90 MG PO TABS: 90.0000 mg | ORAL_TABLET | Freq: Two times a day (BID) | ORAL | 2 refills | Status: AC

## 2023-08-29 MED ORDER — NITROGLYCERIN 0.4 MG SL SUBL
0.4000 mg | SUBLINGUAL_TABLET | SUBLINGUAL | 3 refills | Status: AC | PRN
Start: 2023-08-29 — End: ?

## 2023-08-29 MED ORDER — METOPROLOL TARTRATE 25 MG PO TABS: 25.0000 mg | ORAL_TABLET | Freq: Two times a day (BID) | ORAL | 5 refills | Status: DC

## 2023-08-29 MED ORDER — ASPIRIN 81 MG PO TBEC: 81.0000 mg | DELAYED_RELEASE_TABLET | Freq: Every day | ORAL | Status: AC

## 2023-08-29 MED ORDER — ISOSORBIDE MONONITRATE ER 120 MG PO TB24: 120.0000 mg | ORAL_TABLET | Freq: Every day | ORAL | 5 refills | Status: DC

## 2023-08-29 MED ORDER — ATORVASTATIN CALCIUM 80 MG PO TABS
80.0000 mg | ORAL_TABLET | Freq: Every day | ORAL | 5 refills | Status: AC
Start: 2023-08-29 — End: ?

## 2023-08-29 MED ORDER — PANTOPRAZOLE SODIUM 40 MG PO TBEC: 40.0000 mg | DELAYED_RELEASE_TABLET | Freq: Every day | ORAL | 1 refills | Status: DC

## 2023-08-29 NOTE — Progress Notes (Unsigned)
 Cardiology Office Note   Date:  08/30/2023  ID:  Darren, Garrett [birth year], MRN [id number] PCP: Pcp, No  [facility] Providers Cardiologist:  Ozell Fell, MD     History of Present Illness Darren Garrett is a 48 y.o. male with history of CAD (, HTN, DM2, GERD, hypothyroidism, HLD  Hospitalized in [place]  03/2023 with anterior STEMI multivessel PCI to ostial LAD to LM X1, mid LAD X1, OM 3 X1 requiring IABP.  Echo LVEF 58%, wall motion abnormalities, mild AI.  Records available in Care Everywhere.  Admitted NSTEMI 04/2023 with LHC likely diagonal territory infarct with limited benefit of late intervention as ongoing chest pain ~20 hours.  He was recommended for medical therapy.  LVEF 50 to 55%.  Catheterization otherwise notable for patent left main with nonobstructive plaquing and patent stent, patent LAD stents with total occlusion of first diagonal stent, patent circumflex with patent mid to distal vessel stent, severe distal OM branch stenosis not suitable for PCI, widely patent RCA with mild to moderate PDA stenosis (large dominant vessel).  Nonadherence has been recurrent issue.  At visit with pharmacy team 06/19/2023 his medications were reviewed in detail.  Presents today for follow-up independently after contacting the office noting chest pain and declining ED evaluation.  He politely declines use of interpreter today.  Reports chest pain is mid upper chest particularly with significant exertion or after eating hot food.  He has not been taking the majority of his medications.  He is presently only taking Imdur  most mornings sometimes also in the evenings.  He is not taking any other medications including aspirin , brillinta, statin and has thrown out all other medications. This chest pain is occurring daily and lasting about 2 hours.  Chest pain is associated with dyspnea but no diaphoresis.  He has had some dyspnea at night requiring him to sit up consistent with orthopnea.  He  works in a [employer] and reports maintaining a busy schedule. He does not monitor BP or HR at home.   Dispense report per pharmacy: (those in red out of date) 06/19/23 Atorvastatin  80mg  daily 90 day supplyl 04/04/23 farxiga  10mg  daily 30 day supply 07/21/23 imdur  90 day supply 06/19/23 Losartan  hydrochlorothiazide 90 day supply 06/19/23 metoprolol  90 day supply 06/20/23 brilinta  90 day supply  ROS: Please see the history of present illness.    All other systems reviewed and are negative.   Studies Reviewed      Cardiac Studies & Procedures   ______________________________________________________________________________________________ CARDIAC CATHETERIZATION  CARDIAC CATHETERIZATION 04/03/2023  Conclusion   1st Diag lesion is 100% stenosed.   RPDA lesion is 50% stenosed.   1st Mrg lesion is 80% stenosed.   Prox Cx to Mid Cx lesion is 50% stenosed.   Previously placed Dist LM to Prox LAD stent of unknown type is  widely patent.   Previously placed Mid LAD stent of unknown type is  widely patent.   Previously placed Mid Cx to Dist Cx stent of unknown type is  widely patent.   There is mild left ventricular systolic dysfunction.   LV end diastolic pressure is normal.   The left ventricular ejection fraction is 50-55% by visual estimate.   Recommend uninterrupted dual antiplatelet therapy with Aspirin  81mg  daily and Ticagrelor  90mg  twice daily for a minimum of 12 months (ACS-Class I recommendation).  1.  Patent left main with nonobstructive plaquing, with patent stent extending from the left main into the proximal LAD 2.  Patent LAD stents with  total occlusion of the first diagonal stent (small caliber diagonal) 3.  Patent circumflex with a patent mid to distal vessel stent, severe distal OM branch stenosis not suitable for PCI due to small caliber vessel. 4.  Widely patent RCA with mild to moderate PDA stenosis (large dominant vessel) 5.  Mild segmental LV dysfunction with moderate  hypokinesis of the distal anterolateral wall, LVEF estimated at 50 to 55%  Recommendations: The patient has had ongoing chest discomfort since 11 AM yesterday (approximately 20 hours), now chest pain-free.  I suspect he has sustained an diagonal territory infarct with very limited benefit of late intervention.  This appears to have supplied a small area of myocardium.  Recommend medical therapy.  LVEDP is normal and he appears to have only mild LV dysfunction.  Findings Coronary Findings Diagnostic  Dominance: Right  Left Main There is mild diffuse disease throughout the vessel. The left main is patent with mild nonobstructive plaquing.  The stent that extends back into the left main is widely patent with no stenosis. Previously placed Dist LM to Prox LAD stent of unknown type is  widely patent.  Left Anterior Descending The LAD is patent throughout its course to the apex.  The stented segment at the ostium of the LAD is widely patent.  The long segment of stents throughout the mid LAD are patent without significant restenosis.  There is mild nonobstructive plaque proximal to the stented segment in the mid vessel and there is mild nonobstructive plaque off the distal edge of the stented segment.  The apical LAD is patent. Previously placed Mid LAD stent of unknown type is  widely patent.  First Diagonal Branch The diagonal branch is totally occluded within the stented segment.  This is the patient's culprit.  The vessel appears small in caliber. 1st Diag lesion is 100% stenosed. The lesion was previously treated .  Left Circumflex The circumflex and obtuse marginal branches are patent.  The distal portion of the first OM branch has tight obstructive disease as it bifurcates but the vessel in that area is 1 mm or less.  The AV circumflex is patent with mild plaque of 50% just after the first OM origin and then a widely patent stent leading into an OM 3 branch. Prox Cx to Mid Cx lesion is 50%  stenosed. Previously placed Mid Cx to Dist Cx stent of unknown type is  widely patent.  First Obtuse Marginal Branch 1st Mrg lesion is 80% stenosed.  Right Coronary Artery The vessel exhibits minimal luminal irregularities. The RCA is a large, dominant vessel.  The vessel is widely patent throughout with only minor irregularities.  The PDA branch has a 40 to 50% stenosis present.  There are no high-grade obstructive lesions throughout the RCA distribution.  Right Posterior Descending Artery RPDA lesion is 50% stenosed.  Intervention  No interventions have been documented.     ECHOCARDIOGRAM  ECHOCARDIOGRAM COMPLETE 04/03/2023  Narrative ECHOCARDIOGRAM REPORT    Patient Name:   Darren Garrett Date of Exam: 04/03/2023 Medical Rec #:  [id number]    Height:       66.0 in Accession #:    [id number]   Weight:       165.0 lb Date of Birth:  [birth year]    BSA:          1.843 m Patient Age:    47 years     BP:           123/72 mmHg Patient Gender: M  HR:           73 bpm. Exam Location:  Inpatient  Procedure: 2D Echo, Cardiac Doppler, Color Doppler and Intracardiac Opacification Agent (Both Spectral and Color Flow Doppler were utilized during procedure).  Indications:    Nstemi  History:        Patient has no prior history of Echocardiogram examinations. Risk Factors:Hypertension and Diabetes.  Sonographer:    Ozell Free Referring Phys: LUE DEL HENDERSON  IMPRESSIONS   1. Left ventricular ejection fraction, by estimation, is 60 to 65%. The left ventricle has normal function. The left ventricle has no regional wall motion abnormalities. There is mild concentric left ventricular hypertrophy. Left ventricular diastolic parameters are consistent with Grade I diastolic dysfunction (impaired relaxation). 2. Right ventricular systolic function is normal. The right ventricular size is normal. 3. The mitral valve is normal in structure. No evidence of mitral valve  regurgitation. No evidence of mitral stenosis. 4. The aortic valve is tricuspid. There is mild calcification of the aortic valve. Aortic valve regurgitation is trivial. Aortic valve sclerosis/calcification is present, without any evidence of aortic stenosis. 5. The inferior vena cava is normal in size with greater than 50% respiratory variability, suggesting right atrial pressure of 3 mmHg.  FINDINGS Left Ventricle: Left ventricular ejection fraction, by estimation, is 60 to 65%. The left ventricle has normal function. The left ventricle has no regional wall motion abnormalities. Definity  contrast agent was given IV to delineate the left ventricular endocardial borders. The left ventricular internal cavity size was normal in size. There is mild concentric left ventricular hypertrophy. Left ventricular diastolic parameters are consistent with Grade I diastolic dysfunction (impaired relaxation).  Right Ventricle: The right ventricular size is normal. No increase in right ventricular wall thickness. Right ventricular systolic function is normal.  Left Atrium: Left atrial size was normal in size.  Right Atrium: Right atrial size was normal in size.  Pericardium: There is no evidence of pericardial effusion.  Mitral Valve: The mitral valve is normal in structure. No evidence of mitral valve regurgitation. No evidence of mitral valve stenosis.  Tricuspid Valve: The tricuspid valve is normal in structure. Tricuspid valve regurgitation is not demonstrated. No evidence of tricuspid stenosis.  Aortic Valve: The aortic valve is tricuspid. There is mild calcification of the aortic valve. Aortic valve regurgitation is trivial. Aortic valve sclerosis/calcification is present, without any evidence of aortic stenosis. Aortic valve mean gradient measures 6.0 mmHg. Aortic valve peak gradient measures 10.4 mmHg. Aortic valve area, by VTI measures 2.34 cm.  Pulmonic Valve: The pulmonic valve was normal in  structure. Pulmonic valve regurgitation is not visualized. No evidence of pulmonic stenosis.  Aorta: The aortic root is normal in size and structure.  Venous: The inferior vena cava is normal in size with greater than 50% respiratory variability, suggesting right atrial pressure of 3 mmHg.  IAS/Shunts: No atrial level shunt detected by color flow Doppler.   LEFT VENTRICLE PLAX 2D LVIDd:         4.70 cm   Diastology LVIDs:         3.30 cm   LV e' medial:    7.07 cm/s LV PW:         1.00 cm   LV E/e' medial:  11.1 LV IVS:        1.00 cm   LV e' lateral:   9.79 cm/s LVOT diam:     2.00 cm   LV E/e' lateral: 8.0 LV SV:  78 LV SV Index:   42 LVOT Area:     3.14 cm   RIGHT VENTRICLE RV Basal diam:  3.40 cm RV S prime:     13.50 cm/s TAPSE (M-mode): 2.3 cm  LEFT ATRIUM             Index        RIGHT ATRIUM           Index LA diam:        4.20 cm 2.28 cm/m   RA Area:     11.70 cm LA Vol (A2C):   59.1 ml 32.07 ml/m  RA Volume:   23.20 ml  12.59 ml/m LA Vol (A4C):   37.9 ml 20.57 ml/m LA Biplane Vol: 47.3 ml 25.67 ml/m AORTIC VALVE AV Area (Vmax):    2.20 cm AV Area (Vmean):   2.35 cm AV Area (VTI):     2.34 cm AV Vmax:           161.00 cm/s AV Vmean:          108.000 cm/s AV VTI:            0.334 m AV Peak Grad:      10.4 mmHg AV Mean Grad:      6.0 mmHg LVOT Vmax:         113.00 cm/s LVOT Vmean:        80.700 cm/s LVOT VTI:          0.249 m LVOT/AV VTI ratio: 0.75  AORTA Ao Root diam: 3.20 cm Ao Asc diam:  2.80 cm  MITRAL VALVE MV Area (PHT): 2.73 cm    SHUNTS MV Decel Time: 278 msec    Systemic VTI:  0.25 m MV E velocity: 78.60 cm/s  Systemic Diam: 2.00 cm MV A velocity: 84.40 cm/s MV E/A ratio:  0.93  Toribio Fuel MD Electronically signed by Toribio Fuel MD Signature Date/Time: 04/03/2023/2:08:17 PM    Final          ______________________________________________________________________________________________      Risk  Assessment/Calculations   HYPERTENSION CONTROL Vitals:   08/29/23 0847 08/29/23 0912  BP: (!) 152/100 (!) 158/100    The patient's blood pressure is elevated above target today.  In order to address the patient's elevated BP: A current anti-hypertensive medication was adjusted today.; Follow up with general cardiology has been recommended.; A new medication was prescribed today.          Physical Exam VS:  BP (!) 158/100   Pulse 87   Ht 5' 6 (1.676 m)   Wt 175 lb 11.2 oz (79.7 kg)   SpO2 98%   BMI 28.36 kg/m        Wt Readings from Last 3 Encounters:  08/29/23 175 lb 11.2 oz (79.7 kg)  04/13/23 169 lb 3.2 oz (76.7 kg)  04/09/23 165 lb (74.8 kg)    GEN: Well nourished, well developed in no acute distress NECK: No JVD; No carotid bruits CARDIAC: RRR, no murmurs, rubs, gallops RESPIRATORY:  Clear to auscultation without rales, wheezing or rhonchi  ABDOMEN: Soft, non-tender, non-distended EXTREMITIES:  No edema; No deformity   ASSESSMENT AND PLAN  CAD - STEMI 03/2023 and NSTEMI 04/2023 detailed in HPI. EKG today with 1mm ST elevation in lead V2. He did have ST elevation in V1-V3 in EKG from March by my review. Recurrent chest pain c/w angina (with exertion) in setting of medication nonadherence. Has declined ED evaluation. No chest pain in clinic today. Firmly  discussed risk of recurrent MI and death in stopping antiplatelet and statin.  Resume medical therapy as follows: Aspirin  EC 81mg  daily, Imdur  120mg  daily, Atorvastatin  80mg  daily, Brilinta  90mg  BID, Metoprolol  tartrate 25mg  BID, nitroglycerin  PRN.   HTN - BP not at goal <130/80 in setting of medication adherence. While ARB would have cardioprotective benefit in setting of DM2, given his preference to avoid multiple medications will consolidate to Imdur  120mg  daily and Metoprolol  tartrate 25mg  BID as above. Can reconsider ARB in future.   HLD, LDL goal <55 - Resume atorvastatin  80mg  daily, as above. LDL goal <55 given two  ASCVD risk events in <12 months.   DM2 - not taking any diabetic medications. Not established with pcp. Not addressed at this clinic visit due to urgent nature of chest pain management, as above.   GERD - resume protonix  40mg  daily.  Hyperthyroidism - noted during admission 03/2023 in [place]. Not addressed at this  clinic visit.        Dispo: Follow-up in 2 weeks  Signed, Reche GORMAN Finder, NP

## 2023-08-29 NOTE — Patient Instructions (Addendum)
 Medication Instructions:   RESUME cardiac medications  Aspirin  EC 81mg  ONCE daily This protects stents, prevents heart attack Imdur  (Isosorbide  Mononitrate) 120mg  ONCE daily This prevents chest pain, improves blood pressure Atorvastatin  80mg  ONCE daily This protects stents, lowers cholesterol, prevents heart attack Brilinta  (ticalegor) 90 mg TWICE daily This protects stents Metoprolol  tartrate 25mg  TWICE daily This prevents chest pain, improves blood pressure, prevents heart attack  We are sending Nitroglycerin  AS NEEDED for chest pain  For as needed Nitroglycerin , if you develop chest pain: Sit and rest 5 minutes. If chest pain does not resolve place 1 nitroglycerin  under your tongue and wait 5 minutes. If chest pain does not resolve, place a 2nd nitroglycerin  under your tongue and wait 5 more minutes. If chest pain does not resolve, place a 3rd nitroglycerin  under your tongue and call 911  RESUME Pantoprazole  (protonix ) 40mg  daily for acid reflux  We have sent prescriptions [facility]. Please throw out old medications.  *If you need a refill on your cardiac medications before your next appointment, please call your pharmacy*  Follow-Up: At [facility], you and your health needs are our priority.  As part of our continuing mission to provide you with exceptional heart care, our providers are all part of one team.  This team includes your primary Cardiologist (physician) and Advanced Practice Providers or APPs (Physician Assistants and Nurse Practitioners) who all work together to provide you with the care you need, when you need it.  Your next appointment:   2 week(s)  Provider:   Ozell Fell, MD or One of our Advanced Practice Providers (APPs): Morse Clause, PA-C  Lamarr Satterfield, NP Miriam Shams, NP  Olivia Pavy, PA-C Josefa Beauvais, NP  Leontine Salen, PA-C Orren Fabry, PA-C  [place], PA-C Ernest Dick, NP  Damien Braver, NP Jon Hails, PA-C  Waddell Donath,  PA-C    Dayna Dunn, PA-C  Scott Weaver, PA-C Lum Louis, NP Katlyn West, NP Callie Goodrich, PA-C  Evan Williams, PA-C Sheng Haley, PA-C  Xika Zhao, NP Kathleen Johnson, PA-C   We recommend signing up for the patient portal called MyChart.  Sign up information is provided on this After Visit Summary.  MyChart is used to connect with patients for Virtual Visits (Telemedicine).  Patients are able to view lab/test results, encounter notes, upcoming appointments, etc.  Non-urgent messages can be sent to your provider as well.   To learn more about what you can do with MyChart, go to [email or web address].   Other Instructions  If you have chest pain that does not improve with nitroglycerin  you should call 911 and go to the emergency room.

## 2023-08-30 ENCOUNTER — Encounter (HOSPITAL_BASED_OUTPATIENT_CLINIC_OR_DEPARTMENT_OTHER): Payer: Self-pay | Admitting: Family

## 2023-09-18 ENCOUNTER — Ambulatory Visit: Attending: Cardiovascular Disease | Admitting: Cardiovascular Disease

## 2023-09-18 ENCOUNTER — Telehealth: Payer: Self-pay | Admitting: Cardiovascular Disease

## 2023-09-18 ENCOUNTER — Encounter: Payer: Self-pay | Admitting: Cardiovascular Disease

## 2023-09-18 VITALS — BP 122/70 | HR 93 | Ht 66.0 in | Wt 170.0 lb

## 2023-09-18 DIAGNOSIS — E785 Hyperlipidemia, unspecified: Secondary | ICD-10-CM | POA: Diagnosis not present

## 2023-09-18 DIAGNOSIS — I10 Essential (primary) hypertension: Secondary | ICD-10-CM | POA: Diagnosis not present

## 2023-09-18 DIAGNOSIS — I25118 Atherosclerotic heart disease of native coronary artery with other forms of angina pectoris: Secondary | ICD-10-CM | POA: Diagnosis not present

## 2023-09-18 DIAGNOSIS — Z794 Long term (current) use of insulin: Secondary | ICD-10-CM

## 2023-09-18 DIAGNOSIS — E118 Type 2 diabetes mellitus with unspecified complications: Secondary | ICD-10-CM | POA: Diagnosis not present

## 2023-09-18 NOTE — Patient Instructions (Addendum)
 Medication Instructions:  STOP taking Ibuprofen  *You can take Tylenol PM if desired for sleep   *If you need a refill on your cardiac medications before your next appointment, please call your pharmacy*  Lab Work: To be completed today: A1C, lipid panel, CMP, CBC  If you have labs (blood work) drawn today and your tests are completely normal, you will receive your results only by: MyChart Message (if you have MyChart) OR A paper copy in the mail If you have any lab test that is abnormal or we need to change your treatment, we will call you to review the results.  Testing/Procedures: None ordered today.  Follow-Up: At [facility], you and your health needs are our priority.  As part of our continuing mission to provide you with exceptional heart care, our providers are all part of one team.  This team includes your primary Cardiologist (physician) and Advanced Practice Providers or APPs (Physician Assistants and Nurse Practitioners) who all work together to provide you with the care you need, when you need it.  Your next appointment:   6 month(s)  Provider:   One of our Advanced Practice Providers (APPs): Morse Clause, PA-C  Lamarr Satterfield, NP Miriam Shams, NP  Olivia Pavy, PA-C Josefa Beauvais, NP  Leontine Salen, PA-C Orren Fabry, PA-C  [place], PA-C Ernest Dick, NP  Damien Braver, NP Jon Hails, PA-C  Waddell Donath, PA-C    Dayna Dunn, PA-C  Scott Weaver, PA-C Lum Louis, NP Katlyn West, NP Callie Goodrich, PA-C  Evan Williams, PA-C Sheng Haley, PA-C  Xika Zhao, NP Kathleen Johnson, PA-C    We recommend signing up for the patient portal called MyChart.  Sign up information is provided on this After Visit Summary.  MyChart is used to connect with patients for Virtual Visits (Telemedicine).  Patients are able to view lab/test results, encounter notes, upcoming appointments, etc.  Non-urgent messages can be sent to your provider as well.   To learn more  about what you can do with MyChart, go to [email or web address].   Other Instructions To find a new primary care provider, please visit the link below:  [email or web address]

## 2023-09-18 NOTE — Telephone Encounter (Signed)
 Patient needs to sign an interpreter waiver. 09/18/2023.  Patient waited but decided to leave before getting document due to wait and busy times.

## 2023-09-18 NOTE — Progress Notes (Signed)
 Cardiology Office Note:    Date:  09/18/2023   ID:  Darren Garrett, DOB [birth year], MRN [id number]  PCP:  Darren Garrett   [facility] Providers Cardiologist:  Darren Fell, MD     Referring MD: No ref. provider found   Chief Complaint  Patient presents with   Coronary Artery Disease    History of Present Illness:    Darren Garrett is a 48 y.o. male with a hx of CAD, presenting for follow-up evaluation. He initially presented with an anterior STEMI in [place]  requiring stenting of the LAD and diagonal. He then presented at [facility] with NSTEMI in March 2025 and was found to have occlusion of the diagonal stent after a prolonged period of chest pain, with recommendations for medical therapy. He has had repeated issues with medication non-compliance. He was recently seen by Darren Finder, NP for evaluation of chest pain and was not taking his medications again. He was provided education on the importance of medication adherence and started back on his cardiac medicines. BP and diabetes were noted to be uncontrolled.   The patient is here alone today.  He again declined the use of an interpreter.  He continues to work at a [employer] in [place] .  He is back on all of his medications and states that he is feeling better.  He still has some chest pain at times, typically unrelated to physical exertion.  He describes the pain across the upper chest that can occur with or without physical activity.  No shortness of breath or heart palpitations.  Overall states that he is feeling much better since he has been back on his medicines.  He still has not found a primary care physician, nor has he started any treatment for diabetes.  Current Medications: Current Meds  Medication Sig   aspirin  EC 81 MG tablet Take 1 tablet (81 mg total) by mouth daily. Swallow whole.   atorvastatin  (LIPITOR) 80 MG tablet Take 1 tablet (80 mg total) by mouth daily.   ibuprofen (ADVIL) 200 MG tablet Take 200 mg by  mouth every 6 (six) hours as needed.   isosorbide  mononitrate (IMDUR ) 120 MG 24 hr tablet Take 1 tablet (120 mg total) by mouth daily.   LORazepam (ATIVAN) 1 MG tablet Take 1 mg by mouth every 8 (eight) hours as needed for anxiety.   metoprolol  tartrate (LOPRESSOR ) 25 MG tablet Take 1 tablet (25 mg total) by mouth 2 (two) times daily.   nitroGLYCERIN  (NITROSTAT ) 0.4 MG SL tablet Place 1 tablet (0.4 mg total) under the tongue every 5 (five) minutes as needed for chest pain.   pantoprazole  (PROTONIX ) 40 MG tablet Take 1 tablet (40 mg total) by mouth daily.   ticagrelor  (BRILINTA ) 90 MG TABS tablet Take 1 tablet (90 mg total) by mouth 2 (two) times daily.     Allergies:   Patient has no known allergies.   ROS:   Please see the history of present illness.    All other systems reviewed and are negative.  EKGs/Labs/Other Studies Reviewed:    The following studies were reviewed today: Cardiac Studies & Procedures   ______________________________________________________________________________________________ CARDIAC CATHETERIZATION  CARDIAC CATHETERIZATION 04/03/2023  Conclusion   1st Diag lesion is 100% stenosed.   RPDA lesion is 50% stenosed.   1st Mrg lesion is 80% stenosed.   Prox Cx to Mid Cx lesion is 50% stenosed.   Previously placed Dist LM to Prox LAD stent of unknown type is  widely patent.  Previously placed Mid LAD stent of unknown type is  widely patent.   Previously placed Mid Cx to Dist Cx stent of unknown type is  widely patent.   There is mild left ventricular systolic dysfunction.   LV end diastolic pressure is normal.   The left ventricular ejection fraction is 50-55% by visual estimate.   Recommend uninterrupted dual antiplatelet therapy with Aspirin  81mg  daily and Ticagrelor  90mg  twice daily for a minimum of 12 months (ACS-Class I recommendation).  1.  Patent left main with nonobstructive plaquing, with patent stent extending from the left main into the proximal  LAD 2.  Patent LAD stents with total occlusion of the first diagonal stent (small caliber diagonal) 3.  Patent circumflex with a patent mid to distal vessel stent, severe distal OM branch stenosis not suitable for PCI due to small caliber vessel. 4.  Widely patent RCA with mild to moderate PDA stenosis (large dominant vessel) 5.  Mild segmental LV dysfunction with moderate hypokinesis of the distal anterolateral wall, LVEF estimated at 50 to 55%  Recommendations: The patient has had ongoing chest discomfort since 11 AM yesterday (approximately 20 hours), now chest pain-free.  I suspect he has sustained an diagonal territory infarct with very limited benefit of late intervention.  This appears to have supplied a small area of myocardium.  Recommend medical therapy.  LVEDP is normal and he appears to have only mild LV dysfunction.  Findings Coronary Findings Diagnostic  Dominance: Right  Left Main There is mild diffuse disease throughout the vessel. The left main is patent with mild nonobstructive plaquing.  The stent that extends back into the left main is widely patent with no stenosis. Previously placed Dist LM to Prox LAD stent of unknown type is  widely patent.  Left Anterior Descending The LAD is patent throughout its course to the apex.  The stented segment at the ostium of the LAD is widely patent.  The long segment of stents throughout the mid LAD are patent without significant restenosis.  There is mild nonobstructive plaque proximal to the stented segment in the mid vessel and there is mild nonobstructive plaque off the distal edge of the stented segment.  The apical LAD is patent. Previously placed Mid LAD stent of unknown type is  widely patent.  First Diagonal Branch The diagonal branch is totally occluded within the stented segment.  This is the patient's culprit.  The vessel appears small in caliber. 1st Diag lesion is 100% stenosed. The lesion was previously treated .  Left  Circumflex The circumflex and obtuse marginal branches are patent.  The distal portion of the first OM branch has tight obstructive disease as it bifurcates but the vessel in that area is 1 mm or less.  The AV circumflex is patent with mild plaque of 50% just after the first OM origin and then a widely patent stent leading into an OM 3 branch. Prox Cx to Mid Cx lesion is 50% stenosed. Previously placed Mid Cx to Dist Cx stent of unknown type is  widely patent.  First Obtuse Marginal Branch 1st Mrg lesion is 80% stenosed.  Right Coronary Artery The vessel exhibits minimal luminal irregularities. The RCA is a large, dominant vessel.  The vessel is widely patent throughout with only minor irregularities.  The PDA branch has a 40 to 50% stenosis present.  There are no high-grade obstructive lesions throughout the RCA distribution.  Right Posterior Descending Artery RPDA lesion is 50% stenosed.  Intervention  No interventions  have been documented.     ECHOCARDIOGRAM  ECHOCARDIOGRAM COMPLETE 04/03/2023  Narrative ECHOCARDIOGRAM REPORT    Patient Name:   Darren Garrett Date of Exam: 04/03/2023 Medical Rec #:  [id number]    Height:       66.0 in Accession #:    [id number]   Weight:       165.0 lb Date of Birth:  02-01-1975    BSA:          1.843 m Patient Age:    47 years     BP:           123/72 mmHg Patient Gender: M            HR:           73 bpm. Exam Location:  Inpatient  Procedure: 2D Echo, Cardiac Doppler, Color Doppler and Intracardiac Opacification Agent (Both Spectral and Color Flow Doppler were utilized during procedure).  Indications:    Nstemi  History:        Patient has no prior history of Echocardiogram examinations. Risk Factors:Hypertension and Diabetes.  Sonographer:    Darren Free Referring Phys: LUE DEL HENDERSON  IMPRESSIONS   1. Left ventricular ejection fraction, by estimation, is 60 to 65%. The left ventricle has normal function. The left ventricle has  no regional wall motion abnormalities. There is mild concentric left ventricular hypertrophy. Left ventricular diastolic parameters are consistent with Grade I diastolic dysfunction (impaired relaxation). 2. Right ventricular systolic function is normal. The right ventricular size is normal. 3. The mitral valve is normal in structure. No evidence of mitral valve regurgitation. No evidence of mitral stenosis. 4. The aortic valve is tricuspid. There is mild calcification of the aortic valve. Aortic valve regurgitation is trivial. Aortic valve sclerosis/calcification is present, without any evidence of aortic stenosis. 5. The inferior vena cava is normal in size with greater than 50% respiratory variability, suggesting right atrial pressure of 3 mmHg.  FINDINGS Left Ventricle: Left ventricular ejection fraction, by estimation, is 60 to 65%. The left ventricle has normal function. The left ventricle has no regional wall motion abnormalities. Definity  contrast agent was given IV to delineate the left ventricular endocardial borders. The left ventricular internal cavity size was normal in size. There is mild concentric left ventricular hypertrophy. Left ventricular diastolic parameters are consistent with Grade I diastolic dysfunction (impaired relaxation).  Right Ventricle: The right ventricular size is normal. No increase in right ventricular wall thickness. Right ventricular systolic function is normal.  Left Atrium: Left atrial size was normal in size.  Right Atrium: Right atrial size was normal in size.  Pericardium: There is no evidence of pericardial effusion.  Mitral Valve: The mitral valve is normal in structure. No evidence of mitral valve regurgitation. No evidence of mitral valve stenosis.  Tricuspid Valve: The tricuspid valve is normal in structure. Tricuspid valve regurgitation is not demonstrated. No evidence of tricuspid stenosis.  Aortic Valve: The aortic valve is tricuspid. There  is mild calcification of the aortic valve. Aortic valve regurgitation is trivial. Aortic valve sclerosis/calcification is present, without any evidence of aortic stenosis. Aortic valve mean gradient measures 6.0 mmHg. Aortic valve peak gradient measures 10.4 mmHg. Aortic valve area, by VTI measures 2.34 cm.  Pulmonic Valve: The pulmonic valve was normal in structure. Pulmonic valve regurgitation is not visualized. No evidence of pulmonic stenosis.  Aorta: The aortic root is normal in size and structure.  Venous: The inferior vena cava is normal in size with greater than  50% respiratory variability, suggesting right atrial pressure of 3 mmHg.  IAS/Shunts: No atrial level shunt detected by color flow Doppler.   LEFT VENTRICLE PLAX 2D LVIDd:         4.70 cm   Diastology LVIDs:         3.30 cm   LV e' medial:    7.07 cm/s LV PW:         1.00 cm   LV E/e' medial:  11.1 LV IVS:        1.00 cm   LV e' lateral:   9.79 cm/s LVOT diam:     2.00 cm   LV E/e' lateral: 8.0 LV SV:         78 LV SV Index:   42 LVOT Area:     3.14 cm   RIGHT VENTRICLE RV Basal diam:  3.40 cm RV S prime:     13.50 cm/s TAPSE (M-mode): 2.3 cm  LEFT ATRIUM             Index        RIGHT ATRIUM           Index LA diam:        4.20 cm 2.28 cm/m   RA Area:     11.70 cm LA Vol (A2C):   59.1 ml 32.07 ml/m  RA Volume:   23.20 ml  12.59 ml/m LA Vol (A4C):   37.9 ml 20.57 ml/m LA Biplane Vol: 47.3 ml 25.67 ml/m AORTIC VALVE AV Area (Vmax):    2.20 cm AV Area (Vmean):   2.35 cm AV Area (VTI):     2.34 cm AV Vmax:           161.00 cm/s AV Vmean:          108.000 cm/s AV VTI:            0.334 m AV Peak Grad:      10.4 mmHg AV Mean Grad:      6.0 mmHg LVOT Vmax:         113.00 cm/s LVOT Vmean:        80.700 cm/s LVOT VTI:          0.249 m LVOT/AV VTI ratio: 0.75  AORTA Ao Root diam: 3.20 cm Ao Asc diam:  2.80 cm  MITRAL VALVE MV Area (PHT): 2.73 cm    SHUNTS MV Decel Time: 278 msec    Systemic VTI:   0.25 m MV E velocity: 78.60 cm/s  Systemic Diam: 2.00 cm MV A velocity: 84.40 cm/s MV E/A ratio:  0.93  Toribio Fuel MD Electronically signed by Toribio Fuel MD Signature Date/Time: 04/03/2023/2:08:17 PM    Final          ______________________________________________________________________________________________      EKG:        Recent Labs: 04/04/2023: Magnesium 2.0 04/09/2023: ALT 33; BUN 44; Creatinine, Ser 1.90; Hemoglobin 15.6; Platelets 360; Potassium 4.5; Sodium 133  Recent Lipid Panel    Component Value Date/Time   CHOL 167 04/03/2023 0526   TRIG 167 (H) 04/03/2023 0526   HDL 40 (L) 04/03/2023 0526   CHOLHDL 4.2 04/03/2023 0526   VLDL 33 04/03/2023 0526   LDLCALC 94 04/03/2023 0526     Risk Assessment/Calculations:                Physical Exam:    VS:  BP 122/70   Pulse 93   Ht 5' 6 (1.676 m)   Wt 170 lb (  77.1 kg)   SpO2 97%   BMI 27.44 kg/m     Wt Readings from Last 3 Encounters:  09/18/23 170 lb (77.1 kg)  08/29/23 175 lb 11.2 oz (79.7 kg)  04/13/23 169 lb 3.2 oz (76.7 kg)     GEN:  Well nourished, well developed in no acute distress HEENT: Normal NECK: No JVD; No carotid bruits LYMPHATICS: No lymphadenopathy CARDIAC: RRR, no murmurs, rubs, gallops RESPIRATORY:  Clear to auscultation without rales, wheezing or rhonchi  ABDOMEN: Soft, non-tender, non-distended MUSCULOSKELETAL:  No edema; No deformity  SKIN: Warm and dry NEUROLOGIC:  Alert and oriented x 3 PSYCHIATRIC:  Normal affect   Assessment & Plan Coronary artery disease of native artery of native heart with stable angina pectoris (HCC) Chest pain improved on medical therapy with atorvastatin , isosorbide , and metoprolol .  Continue DAPT with aspirin  and ticagrelor .  The patient reports compliance with his medications. Essential hypertension Blood pressure is well-controlled on isosorbide  and metoprolol .  Continue current management. Type 2 diabetes mellitus with  complication, with long-term current use of insulin  (HCC) Check hemoglobin A1c.  Advised the patient that it is critically important for him to establish primary care and initiate treatment for diabetes if indicated. Hyperlipidemia LDL goal <70 Treated with atorvastatin  80 mg daily.  Check lipids and LFTs today.  Last LDL cholesterol was 94 mg/dL when he was off of his medicine.  I reviewed the patient's medications today.  I advised him to stop taking ibuprofen as needed since he is on aspirin  and ticagrelor .  Advised that he could use Tylenol PM for sleep if needed.  Will arrange cardiology follow-up in about 6 months.  Will give him contact information to establish with primary care.  Will contact him with lab results once they are available.     Medication Adjustments/Labs and Tests Ordered: Current medicines are reviewed at length with the patient today.  Concerns regarding medicines are outlined above.  Orders Placed This Encounter  Procedures   CBC   Comprehensive metabolic panel with GFR   Lipid panel   HgB A1c   No orders of the defined types were placed in this encounter.   Patient Instructions  Medication Instructions:  STOP taking Ibuprofen  *You can take Tylenol PM if desired for sleep   *If you need a refill on your cardiac medications before your next appointment, please call your pharmacy*  Lab Work: To be completed today: A1C, lipid panel, CMP, CBC  If you have labs (blood work) drawn today and your tests are completely normal, you will receive your results only by: MyChart Message (if you have MyChart) OR A paper copy in the mail If you have any lab test that is abnormal or we need to change your treatment, we will call you to review the results.  Testing/Procedures: None ordered today.  Follow-Up: At [facility], you and your health needs are our priority.  As part of our continuing mission to provide you with exceptional heart care, our providers  are all part of one team.  This team includes your primary Cardiologist (physician) and Advanced Practice Providers or APPs (Physician Assistants and Nurse Practitioners) who all work together to provide you with the care you need, when you need it.  Your next appointment:   6 month(s)  Provider:   One of our Advanced Practice Providers (APPs): Morse Clause, PA-C  Lamarr Satterfield, NP Miriam Shams, NP  Olivia Pavy, PA-C Josefa Beauvais, NP  Leontine Salen, PA-C Orren Fabry,  PA-C  Hao Meng, PA-C Jackee Alberts, NP  Damien Braver, NP Jon Hails, PA-C  Taylor Parcells, PA-C    Dayna Dunn, PA-C  Scott Weaver, PA-C Lum Louis, NP Katlyn West, NP Callie Goodrich, PA-C  Evan Williams, PA-C Sheng Haley, PA-C  Xika Zhao, NP Kathleen Johnson, PA-C    We recommend signing up for the patient portal called MyChart.  Sign up information is provided on this After Visit Summary.  MyChart is used to connect with patients for Virtual Visits (Telemedicine).  Patients are able to view lab/test results, encounter notes, upcoming appointments, etc.  Non-urgent messages can be sent to your provider as well.   To learn more about what you can do with MyChart, go to [email or web address].   Other Instructions To find a new primary care provider, please visit the link below:  [email or web address]    Signed, Darren Fell, MD  09/18/2023 12:26 PM    [facility]

## 2023-09-18 NOTE — Assessment & Plan Note (Signed)
 Check hemoglobin A1c.  Advised the patient that it is critically important for him to establish primary care and initiate treatment for diabetes if indicated.

## 2023-11-15 ENCOUNTER — Ambulatory Visit: Payer: Self-pay | Admitting: Physician Assistant

## 2023-11-15 LAB — HEMOGLOBIN A1C
Est. average glucose Bld gHb Est-mCnc: 223 mg/dL
Hgb A1c MFr Bld: 9.4 % — ABNORMAL HIGH (ref 4.8–5.6)

## 2023-11-15 LAB — COMPREHENSIVE METABOLIC PANEL WITH GFR
ALT: 44 IU/L (ref 0–44)
AST: 25 IU/L (ref 0–40)
Albumin: 4.3 g/dL (ref 4.1–5.1)
Alkaline Phosphatase: 96 IU/L (ref 47–123)
BUN/Creatinine Ratio: 15 (ref 9–20)
BUN: 14 mg/dL (ref 6–24)
Bilirubin Total: 0.6 mg/dL (ref 0.0–1.2)
CO2: 23 mmol/L (ref 20–29)
Calcium: 9.3 mg/dL (ref 8.7–10.2)
Chloride: 99 mmol/L (ref 96–106)
Creatinine, Ser: 0.92 mg/dL (ref 0.76–1.27)
Globulin, Total: 2.7 g/dL (ref 1.5–4.5)
Glucose: 162 mg/dL — ABNORMAL HIGH (ref 70–99)
Potassium: 4 mmol/L (ref 3.5–5.2)
Sodium: 138 mmol/L (ref 134–144)
Total Protein: 7 g/dL (ref 6.0–8.5)
eGFR: 102 mL/min/1.73 (ref >59)

## 2023-11-15 LAB — LIPID PANEL
Chol/HDL Ratio: 5 ratio (ref 0.0–5.0)
Cholesterol, Total: 181 mg/dL (ref 100–199)
HDL: 36 mg/dL — ABNORMAL LOW (ref >39)
LDL Chol Calc (NIH): 103 mg/dL — ABNORMAL HIGH (ref 0–99)
Triglycerides: 247 mg/dL — ABNORMAL HIGH (ref 0–149)
VLDL Cholesterol Cal: 42 mg/dL — ABNORMAL HIGH (ref 5–40)

## 2023-11-15 LAB — CBC
Hematocrit: 47.6 % (ref 37.5–51.0)
Hemoglobin: 15.1 g/dL (ref 13.0–17.7)
MCH: 24.8 pg — ABNORMAL LOW (ref 26.6–33.0)
MCHC: 31.7 g/dL (ref 31.5–35.7)
MCV: 78 fL — ABNORMAL LOW (ref 79–97)
Platelets: 250 x10E3/uL (ref 150–450)
RBC: 6.1 x10E6/uL — ABNORMAL HIGH (ref 4.14–5.80)
RDW: 14.7 % (ref 11.6–15.4)
WBC: 7.7 x10E3/uL (ref 3.4–10.8)

## 2023-11-23 ENCOUNTER — Other Ambulatory Visit (HOSPITAL_BASED_OUTPATIENT_CLINIC_OR_DEPARTMENT_OTHER): Payer: Self-pay | Admitting: Family

## 2023-11-23 DIAGNOSIS — I25118 Atherosclerotic heart disease of native coronary artery with other forms of angina pectoris: Secondary | ICD-10-CM

## 2023-11-23 DIAGNOSIS — I10 Essential (primary) hypertension: Secondary | ICD-10-CM

## 2024-01-08 ENCOUNTER — Other Ambulatory Visit (HOSPITAL_BASED_OUTPATIENT_CLINIC_OR_DEPARTMENT_OTHER): Payer: Self-pay | Admitting: Family

## 2024-01-08 DIAGNOSIS — I25118 Atherosclerotic heart disease of native coronary artery with other forms of angina pectoris: Secondary | ICD-10-CM

## 2024-01-08 DIAGNOSIS — I10 Essential (primary) hypertension: Secondary | ICD-10-CM

## 2024-01-10 ENCOUNTER — Other Ambulatory Visit (HOSPITAL_BASED_OUTPATIENT_CLINIC_OR_DEPARTMENT_OTHER): Payer: Self-pay | Admitting: Family

## 2024-01-10 DIAGNOSIS — K219 Gastro-esophageal reflux disease without esophagitis: Secondary | ICD-10-CM

## 2024-01-10 DIAGNOSIS — I25118 Atherosclerotic heart disease of native coronary artery with other forms of angina pectoris: Secondary | ICD-10-CM

## 2024-02-05 ENCOUNTER — Other Ambulatory Visit (HOSPITAL_BASED_OUTPATIENT_CLINIC_OR_DEPARTMENT_OTHER): Payer: Self-pay | Admitting: Cardiovascular Disease

## 2024-02-05 DIAGNOSIS — I10 Essential (primary) hypertension: Secondary | ICD-10-CM

## 2024-02-05 DIAGNOSIS — I25118 Atherosclerotic heart disease of native coronary artery with other forms of angina pectoris: Secondary | ICD-10-CM

## 2024-02-06 ENCOUNTER — Other Ambulatory Visit (HOSPITAL_BASED_OUTPATIENT_CLINIC_OR_DEPARTMENT_OTHER): Payer: Self-pay | Admitting: Family

## 2024-02-06 DIAGNOSIS — I10 Essential (primary) hypertension: Secondary | ICD-10-CM

## 2024-02-06 DIAGNOSIS — I25118 Atherosclerotic heart disease of native coronary artery with other forms of angina pectoris: Secondary | ICD-10-CM

## 2024-03-05 ENCOUNTER — Ambulatory Visit
Admission: EM | Admit: 2024-03-05 | Discharge: 2024-03-05 | Disposition: A | Discharge status: Home / Self Care | Source: Home / Self Care

## 2024-03-05 DIAGNOSIS — R079 Chest pain, unspecified: Secondary | ICD-10-CM

## 2024-03-05 NOTE — ED Triage Notes (Addendum)
 Pt present with intermittent chest pain. States he has had chest pain for many years. Pt states it started last night again and this morning. Pt states he has chest pain everyday. Pt states he has not seen his cardiologist in a while. Pt denies SOB, headaches and dizziness and nausea. States he has trouble sleeping at night. Pt takes benadryl to try to sleep, is unsuccessful.  States he has rt lower abdominal pain and is concerned of his kidney. States sometimes he cannot walk. When he has pain he feels a biting feeling. States he has had abdominal pain for 1 yr. Pt was given medication in [place]  that gave him relief. Pt unsure of name.  Home interventions: none  Pt states he took Nitroglycerin  for chest pain. Pt is unable to tell this CMA the last time it was taken.

## 2024-03-05 NOTE — ED Triage Notes (Addendum)
 Pt unclear of medication he takes. Medication verified to best of ability. Pt declined interpretation.

## 2024-03-05 NOTE — Discharge Instructions (Signed)
 My medical recommendation is that you go to the emergency room for further evaluation of your chest pain
